# Patient Record
Sex: Male | Born: 1960 | Race: White | Hispanic: No | Marital: Married | Smoking: Never smoker
Health system: Southern US, Community
[De-identification: ages and names within clinical notes are randomized; demographics above are authoritative.]

## PROBLEM LIST (undated history)

## (undated) DIAGNOSIS — E119 Type 2 diabetes mellitus without complications: Secondary | ICD-10-CM

## (undated) DIAGNOSIS — I10 Essential (primary) hypertension: Secondary | ICD-10-CM

## (undated) HISTORY — PX: KNEE SURGERY: SHX244

## (undated) HISTORY — PX: TONSILLECTOMY: SUR1361

---

## 1986-04-18 HISTORY — PX: HERNIA REPAIR: SHX51

## 2003-02-03 ENCOUNTER — Encounter: Payer: Self-pay | Admitting: Specialist

## 2003-02-03 ENCOUNTER — Ambulatory Visit (HOSPITAL_COMMUNITY): Admission: RE | Admit: 2003-02-03 | Discharge: 2003-02-03 | Payer: Self-pay | Admitting: Specialist

## 2003-03-03 ENCOUNTER — Encounter: Admission: RE | Admit: 2003-03-03 | Discharge: 2003-06-01 | Payer: Self-pay | Admitting: Specialist

## 2008-09-16 ENCOUNTER — Encounter: Admission: RE | Admit: 2008-09-16 | Discharge: 2008-09-16 | Payer: Self-pay | Admitting: Emergency Medicine

## 2009-12-20 IMAGING — US US EXTREM LOW NON VASC*R*
1 series · 13 of 13 positions shown · non-contrast
Comparison: None

CLINICAL DATA: Palpable mass right upper thigh.

RIGHT LOWER EXTREMITY SOFT TISSUE ULTRASOUND
TECHNIQUE: Ultrasound examination of the soft tissues was
performed in the area of clinical concern.

[Series 1: us extrem low non vasc*right* · 0.06mm/px · 13 of 13 slices shown]
[im 1/13]
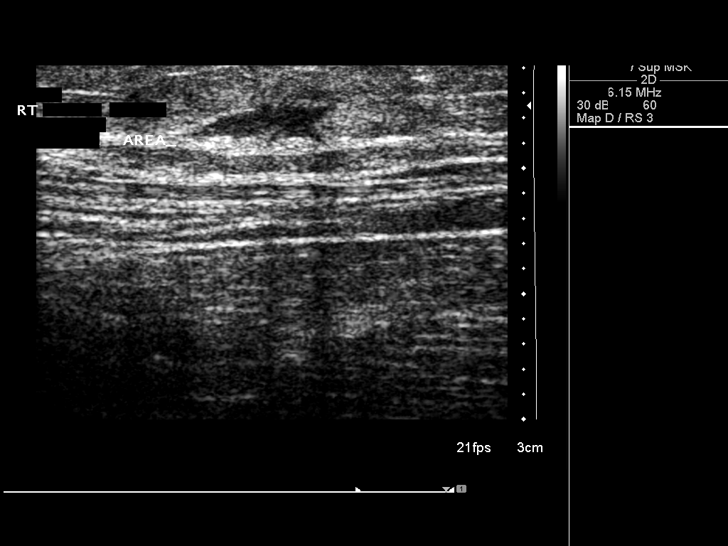
[im 2/13]
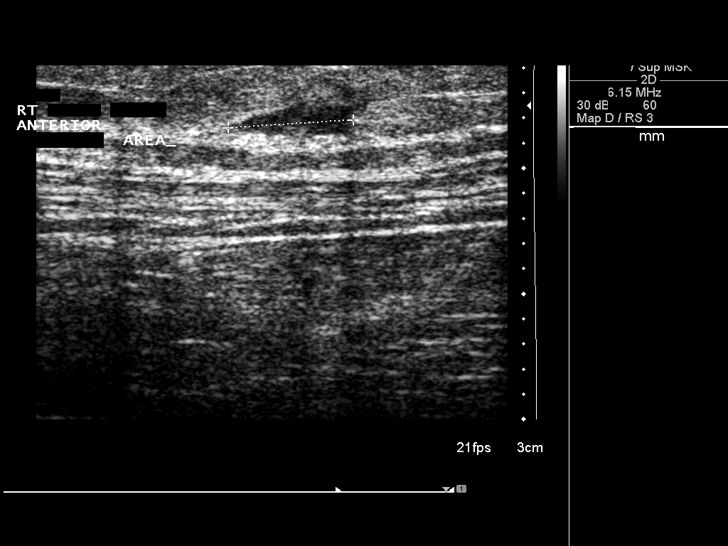
[im 3/13]
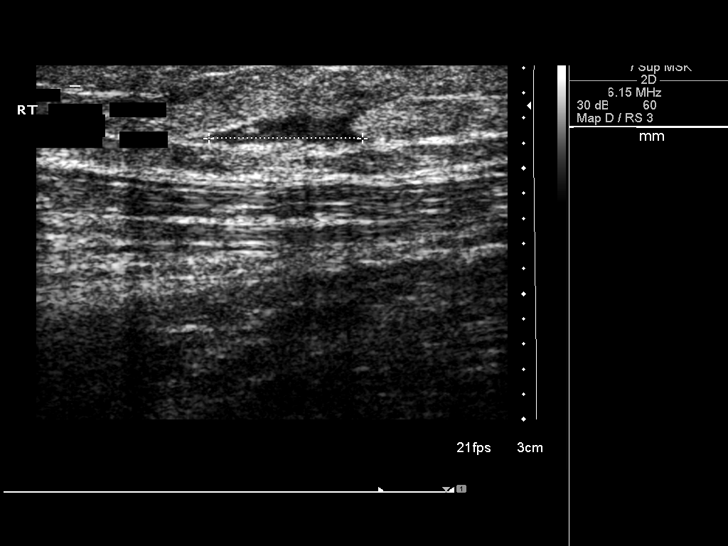
[im 4/13]
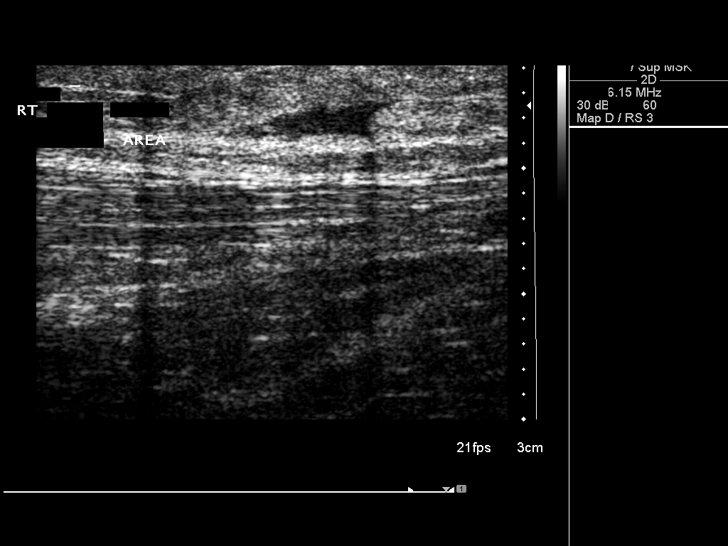
[im 5/13]
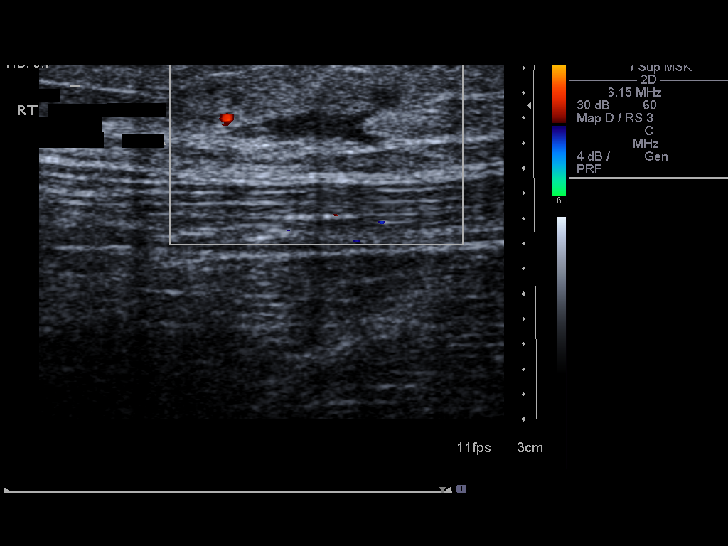
[im 6/13]
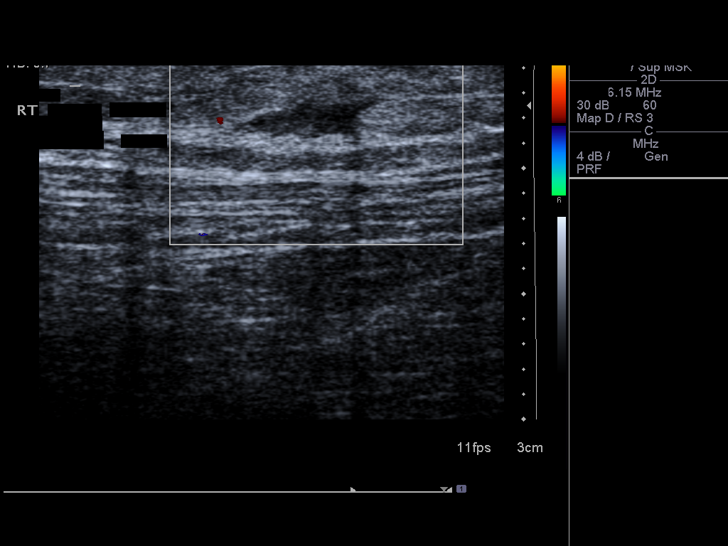
[im 7/13]
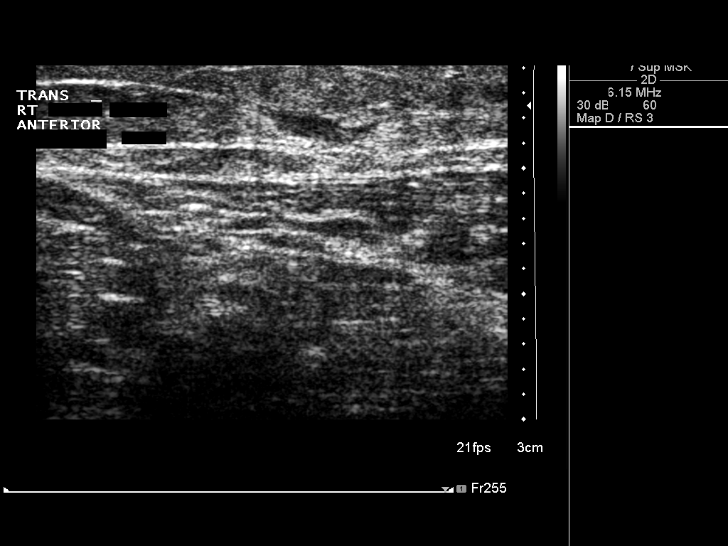
[im 8/13]
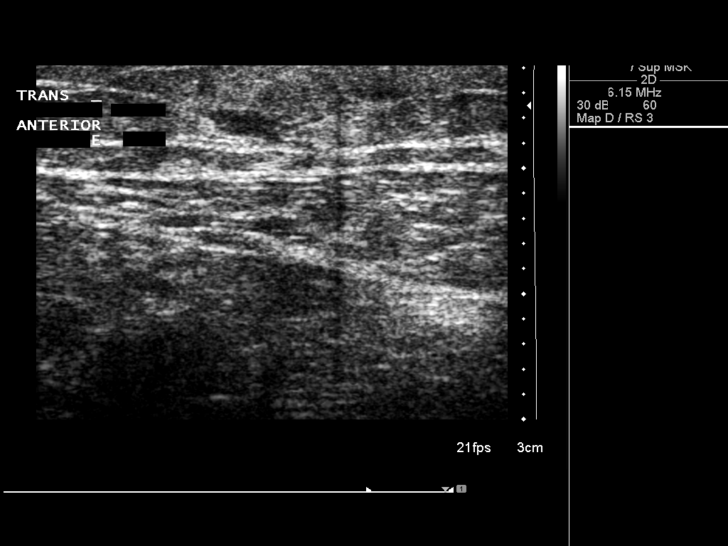
[im 9/13]
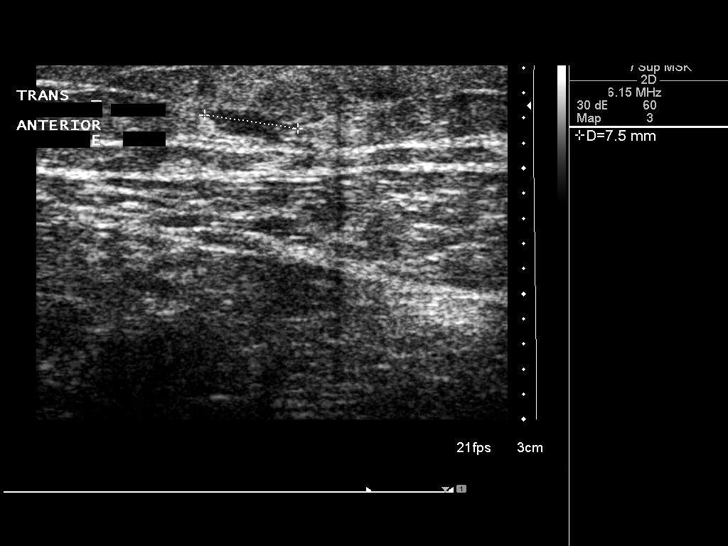
[im 10/13]
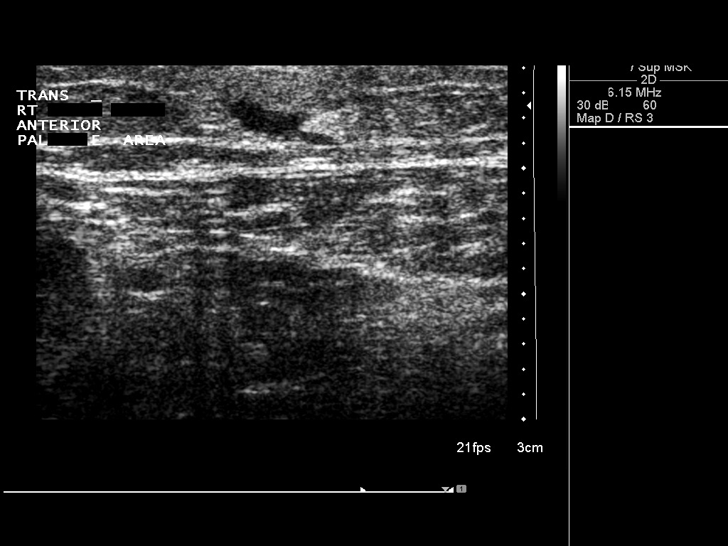
[im 11/13]
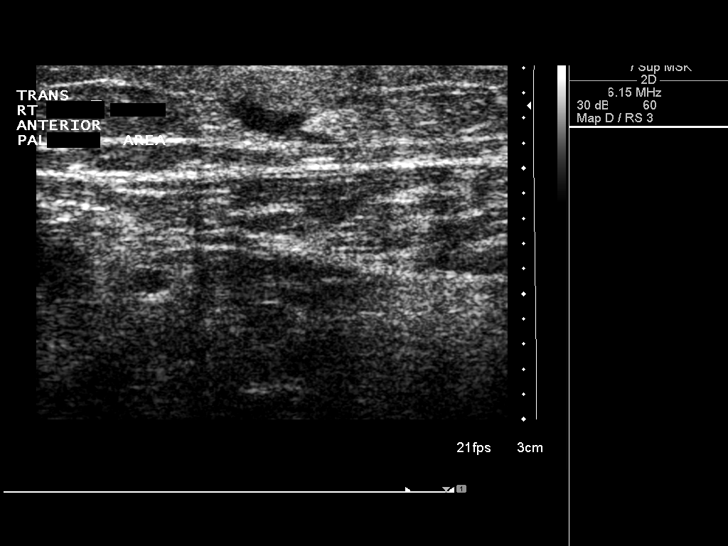
[im 12/13]
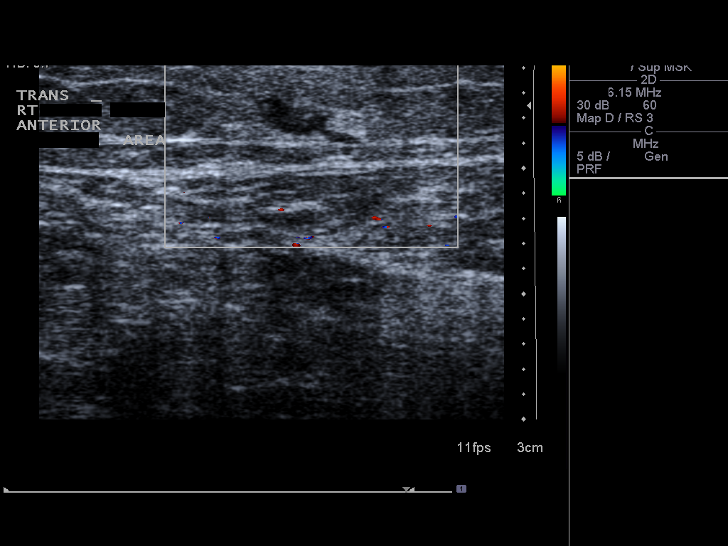
[im 13/13]
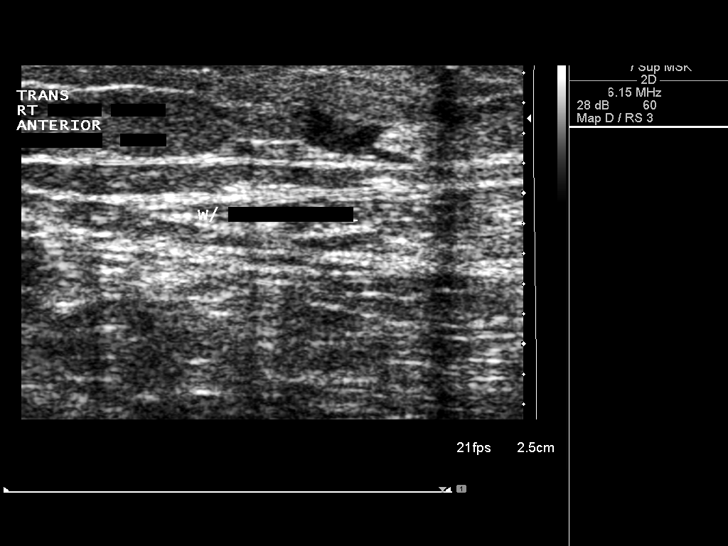

[13 of 13 positions shown; findings below may reference images not displayed]

FINDINGS: In region of palpable concern at the right upper thigh is
superficial nonspecific triangular shape hypoechoic focus measuring
1.2 cm long X 0.3 cm AP X 0.8 cm wide.  No internal or adjacent
blood flow or increased or decreased transmission of ultrasound
beam is seen related to the findings.  No evidence for focal lipoma
or solid lesion visualized.
IMPRESSION: Nonspecific triangular shaped hypoechoic nonvascular focus in
region of palpable concern (favoring slightly complex fluid
collection).  Recommend clinical follow-up.  If lesion persists or
increases, recommend right thigh MRI and/or ultrasound guided
needle aspiration for further specificity as clinically indicated.

## 2011-05-07 ENCOUNTER — Ambulatory Visit (INDEPENDENT_AMBULATORY_CARE_PROVIDER_SITE_OTHER): Payer: 59

## 2011-05-07 DIAGNOSIS — J168 Pneumonia due to other specified infectious organisms: Secondary | ICD-10-CM

## 2011-05-07 DIAGNOSIS — R05 Cough: Secondary | ICD-10-CM

## 2013-09-17 ENCOUNTER — Ambulatory Visit: Payer: 59 | Admitting: Podiatry

## 2014-01-17 ENCOUNTER — Ambulatory Visit (INDEPENDENT_AMBULATORY_CARE_PROVIDER_SITE_OTHER): Payer: 59 | Admitting: Podiatry

## 2014-01-17 ENCOUNTER — Encounter: Payer: Self-pay | Admitting: Podiatry

## 2014-01-17 VITALS — BP 136/76 | HR 84 | Ht 72.0 in | Wt 204.0 lb

## 2014-01-17 DIAGNOSIS — M2041 Other hammer toe(s) (acquired), right foot: Secondary | ICD-10-CM | POA: Insufficient documentation

## 2014-01-17 NOTE — Patient Instructions (Signed)
Seen for left foot lesion. Possible foreign body induced inclusion cyst.  Apply acid patches daily and return in one week for excision of the lesion.

## 2014-01-17 NOTE — Progress Notes (Signed)
Post op visit following tenotomy 3rd digit right. Stated that the toe felt better after 2 days. Suture removed. Compression bandage applied with home care instruction. Return as needed.

## 2014-01-27 ENCOUNTER — Ambulatory Visit: Payer: 59 | Admitting: Podiatry

## 2015-03-02 ENCOUNTER — Encounter: Payer: Self-pay | Admitting: Internal Medicine

## 2015-03-16 ENCOUNTER — Encounter: Payer: Self-pay | Admitting: Internal Medicine

## 2016-04-14 ENCOUNTER — Ambulatory Visit: Payer: Self-pay | Admitting: Podiatry

## 2016-05-10 ENCOUNTER — Ambulatory Visit (INDEPENDENT_AMBULATORY_CARE_PROVIDER_SITE_OTHER): Payer: Self-pay | Admitting: Podiatry

## 2016-05-10 ENCOUNTER — Encounter: Payer: Self-pay | Admitting: Podiatry

## 2016-05-10 DIAGNOSIS — L84 Corns and callosities: Secondary | ICD-10-CM

## 2016-05-10 NOTE — Patient Instructions (Addendum)
Seen for painful calluses. All lesions debrided. May benefit from using Lotrimin cream on cracked skin area. Return as needed.

## 2016-05-10 NOTE — Progress Notes (Signed)
SUBJECTIVE: 56 y.o. year old male presents complaining of painful calluses.  REVIEW OF SYSTEMS: Pertinent items noted in HPI and remainder of comprehensive ROS otherwise negative.  OBJECTIVE: DERMATOLOGIC EXAMINATION: Nails: Dystrophic nails x 10. Calluses: Painful callus under 5th MPJ L>R.  VASCULAR EXAMINATION OF LOWER LIMBS: Pedal pulses: All pedal pulses are palpable with normal pulsation.  No edema or erythema noted. Temperature gradient from tibial crest to dorsum of foot is within normal bilateral.  NEUROLOGIC EXAMINATION OF THE LOWER LIMBS: All epicritic and tactile sensations grossly intact.   MUSCULOSKELETAL EXAMINATION: No gross deformities.  ASSESSMENT: Painful callus under 5th MPJ with cracks under sulcus of 5th toe bilateral.  PLAN: All lesions debrided. May use Lotrimin cream for cracks under sulcus of 5th toe bilateral. Continue using Vitamin A oil for dry skin. Return as needed.

## 2016-07-30 ENCOUNTER — Emergency Department (HOSPITAL_BASED_OUTPATIENT_CLINIC_OR_DEPARTMENT_OTHER)
Admission: EM | Admit: 2016-07-30 | Discharge: 2016-07-30 | Disposition: A | Discharge status: Home / Self Care | Payer: 59 | Attending: Emergency Medicine | Admitting: Emergency Medicine

## 2016-07-30 ENCOUNTER — Emergency Department (HOSPITAL_BASED_OUTPATIENT_CLINIC_OR_DEPARTMENT_OTHER): Payer: 59

## 2016-07-30 ENCOUNTER — Encounter (HOSPITAL_BASED_OUTPATIENT_CLINIC_OR_DEPARTMENT_OTHER): Payer: Self-pay | Admitting: *Deleted

## 2016-07-30 DIAGNOSIS — Z794 Long term (current) use of insulin: Secondary | ICD-10-CM | POA: Insufficient documentation

## 2016-07-30 DIAGNOSIS — Y92410 Unspecified street and highway as the place of occurrence of the external cause: Secondary | ICD-10-CM | POA: Diagnosis not present

## 2016-07-30 DIAGNOSIS — M25512 Pain in left shoulder: Secondary | ICD-10-CM | POA: Insufficient documentation

## 2016-07-30 DIAGNOSIS — E119 Type 2 diabetes mellitus without complications: Secondary | ICD-10-CM | POA: Diagnosis not present

## 2016-07-30 DIAGNOSIS — Y9389 Activity, other specified: Secondary | ICD-10-CM | POA: Insufficient documentation

## 2016-07-30 DIAGNOSIS — Y999 Unspecified external cause status: Secondary | ICD-10-CM | POA: Diagnosis not present

## 2016-07-30 DIAGNOSIS — Z79899 Other long term (current) drug therapy: Secondary | ICD-10-CM | POA: Insufficient documentation

## 2016-07-30 DIAGNOSIS — I10 Essential (primary) hypertension: Secondary | ICD-10-CM | POA: Insufficient documentation

## 2016-07-30 DIAGNOSIS — Z7982 Long term (current) use of aspirin: Secondary | ICD-10-CM | POA: Diagnosis not present

## 2016-07-30 DIAGNOSIS — M542 Cervicalgia: Secondary | ICD-10-CM | POA: Insufficient documentation

## 2016-07-30 DIAGNOSIS — S199XXA Unspecified injury of neck, initial encounter: Secondary | ICD-10-CM | POA: Diagnosis present

## 2016-07-30 HISTORY — DX: Essential (primary) hypertension: I10

## 2016-07-30 HISTORY — DX: Type 2 diabetes mellitus without complications: E11.9

## 2016-07-30 MED ORDER — ACETAMINOPHEN 325 MG PO TABS: 650.0000 mg | ORAL_TABLET | Freq: Four times a day (QID) | ORAL | 0 refills | Status: AC | PRN

## 2016-07-30 MED ORDER — ACETAMINOPHEN 500 MG PO TABS
1000.0000 mg | ORAL_TABLET | Freq: Once | ORAL | Status: AC
  Administered 2016-07-30: 1000 mg via ORAL
  Filled 2016-07-30: qty 2

## 2016-07-30 MED ORDER — METHOCARBAMOL 500 MG PO TABS
1000.0000 mg | ORAL_TABLET | Freq: Once | ORAL | Status: AC
  Administered 2016-07-30: 1000 mg via ORAL
  Filled 2016-07-30: qty 2

## 2016-07-30 MED ORDER — DICLOFENAC SODIUM 3 % TD GEL: 1.0000 "application " | Freq: Two times a day (BID) | TRANSDERMAL | 0 refills | Status: DC

## 2016-07-30 MED ORDER — METHOCARBAMOL 1000 MG/10ML IJ SOLN
1000.0000 mg | Freq: Once | INTRAMUSCULAR | Status: DC
  Filled 2016-07-30: qty 10

## 2016-07-30 MED ORDER — METHOCARBAMOL 500 MG PO TABS: 500.0000 mg | ORAL_TABLET | Freq: Two times a day (BID) | ORAL | 0 refills | Status: DC | PRN

## 2016-07-30 NOTE — ED Notes (Signed)
ED Provider at bedside. 
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
## 2016-12-29 ENCOUNTER — Observation Stay (HOSPITAL_COMMUNITY)
Admission: EM | Admit: 2016-12-29 | Discharge: 2016-12-30 | DRG: 310 | Disposition: A | Discharge status: Home / Self Care | Payer: 59 | Attending: Cardiovascular Disease | Admitting: Cardiovascular Disease

## 2016-12-29 ENCOUNTER — Encounter (HOSPITAL_COMMUNITY): Payer: Self-pay | Admitting: *Deleted

## 2016-12-29 DIAGNOSIS — I4510 Unspecified right bundle-branch block: Secondary | ICD-10-CM | POA: Diagnosis present

## 2016-12-29 DIAGNOSIS — R0683 Snoring: Secondary | ICD-10-CM | POA: Diagnosis present

## 2016-12-29 DIAGNOSIS — E119 Type 2 diabetes mellitus without complications: Secondary | ICD-10-CM | POA: Diagnosis not present

## 2016-12-29 DIAGNOSIS — I440 Atrioventricular block, first degree: Secondary | ICD-10-CM | POA: Diagnosis present

## 2016-12-29 DIAGNOSIS — I4892 Unspecified atrial flutter: Secondary | ICD-10-CM | POA: Diagnosis present

## 2016-12-29 DIAGNOSIS — I483 Typical atrial flutter: Principal | ICD-10-CM

## 2016-12-29 DIAGNOSIS — Z8249 Family history of ischemic heart disease and other diseases of the circulatory system: Secondary | ICD-10-CM

## 2016-12-29 DIAGNOSIS — I10 Essential (primary) hypertension: Secondary | ICD-10-CM | POA: Diagnosis not present

## 2016-12-29 DIAGNOSIS — Z7982 Long term (current) use of aspirin: Secondary | ICD-10-CM

## 2016-12-29 DIAGNOSIS — E785 Hyperlipidemia, unspecified: Secondary | ICD-10-CM | POA: Diagnosis not present

## 2016-12-29 LAB — I-STAT CHEM 8, ED
BUN: 19 mg/dL (ref 6–20)
CALCIUM ION: 1.17 mmol/L (ref 1.15–1.40)
CREATININE: 0.9 mg/dL (ref 0.61–1.24)
Chloride: 101 mmol/L (ref 101–111)
GLUCOSE: 143 mg/dL — AB (ref 65–99)
HCT: 49 % (ref 39.0–52.0)
Hemoglobin: 16.7 g/dL (ref 13.0–17.0)
Potassium: 4.6 mmol/L (ref 3.5–5.1)
Sodium: 139 mmol/L (ref 135–145)
TCO2: 28 mmol/L (ref 22–32)

## 2016-12-29 LAB — BASIC METABOLIC PANEL
ANION GAP: 9 (ref 5–15)
BUN: 15 mg/dL (ref 6–20)
CO2: 26 mmol/L (ref 22–32)
Calcium: 9.7 mg/dL (ref 8.9–10.3)
Chloride: 102 mmol/L (ref 101–111)
Creatinine, Ser: 1.01 mg/dL (ref 0.61–1.24)
GFR calc Af Amer: >60 mL/min (ref >60)
GFR calc non Af Amer: >60 mL/min (ref >60)
Glucose, Bld: 154 mg/dL — ABNORMAL HIGH (ref 65–99)
POTASSIUM: 4.7 mmol/L (ref 3.5–5.1)
Sodium: 137 mmol/L (ref 135–145)

## 2016-12-29 LAB — CBC
HEMATOCRIT: 47.7 % (ref 39.0–52.0)
HEMOGLOBIN: 16.4 g/dL (ref 13.0–17.0)
MCH: 32.1 pg (ref 26.0–34.0)
MCHC: 34.4 g/dL (ref 30.0–36.0)
MCV: 93.3 fL (ref 78.0–100.0)
Platelets: 245 10*3/uL (ref 150–400)
RBC: 5.11 MIL/uL (ref 4.22–5.81)
RDW: 13.8 % (ref 11.5–15.5)
WBC: 6.4 10*3/uL (ref 4.0–10.5)

## 2016-12-29 LAB — MAGNESIUM: Magnesium: 1.9 mg/dL (ref 1.7–2.4)

## 2016-12-29 LAB — TSH: TSH: 1.909 u[IU]/mL (ref 0.350–4.500)

## 2016-12-29 MED ORDER — ATORVASTATIN CALCIUM 40 MG PO TABS
40.0000 mg | ORAL_TABLET | Freq: Every day | ORAL | Status: DC
  Administered 2016-12-30: 40 mg via ORAL
  Filled 2016-12-29: qty 1

## 2016-12-29 MED ORDER — METOPROLOL TARTRATE 25 MG PO TABS
25.0000 mg | ORAL_TABLET | Freq: Two times a day (BID) | ORAL | Status: DC
  Administered 2016-12-30: 25 mg via ORAL
  Filled 2016-12-29: qty 1

## 2016-12-29 MED ORDER — SODIUM CHLORIDE 0.9% FLUSH: 3.0000 mL | INTRAVENOUS | Status: DC | PRN

## 2016-12-29 MED ORDER — DILTIAZEM HCL 100 MG IV SOLR
5.0000 mg/h | INTRAVENOUS | Status: DC
  Filled 2016-12-29: qty 100

## 2016-12-29 MED ORDER — DILTIAZEM HCL 100 MG IV SOLR
5.0000 mg/h | Freq: Once | INTRAVENOUS | Status: AC
  Administered 2016-12-29: 5 mg/h via INTRAVENOUS

## 2016-12-29 MED ORDER — DILTIAZEM HCL 25 MG/5ML IV SOLN
20.0000 mg | Freq: Once | INTRAVENOUS | Status: AC
  Administered 2016-12-29: 20 mg via INTRAVENOUS
  Filled 2016-12-29: qty 5

## 2016-12-29 MED ORDER — ZOLPIDEM TARTRATE 5 MG PO TABS
5.0000 mg | ORAL_TABLET | Freq: Every evening | ORAL | Status: DC | PRN
  Administered 2016-12-29: 5 mg via ORAL
  Filled 2016-12-29: qty 1

## 2016-12-29 MED ORDER — ACETAMINOPHEN 325 MG PO TABS: 650.0000 mg | ORAL_TABLET | ORAL | Status: DC | PRN

## 2016-12-29 MED ORDER — ASPIRIN 81 MG PO CHEW
81.0000 mg | CHEWABLE_TABLET | Freq: Every day | ORAL | Status: DC
  Administered 2016-12-30: 81 mg via ORAL
  Filled 2016-12-29: qty 1

## 2016-12-29 MED ORDER — ONDANSETRON HCL 4 MG/2ML IJ SOLN: 4.0000 mg | Freq: Four times a day (QID) | INTRAMUSCULAR | Status: DC | PRN

## 2016-12-29 MED ORDER — DILTIAZEM HCL 100 MG IV SOLR
5.0000 mg/h | Freq: Once | INTRAVENOUS | Status: AC
  Administered 2016-12-29: 5 mg/h via INTRAVENOUS
  Filled 2016-12-29: qty 100

## 2016-12-29 MED ORDER — APIXABAN 5 MG PO TABS
5.0000 mg | ORAL_TABLET | Freq: Two times a day (BID) | ORAL | Status: DC
  Filled 2016-12-29: qty 1

## 2016-12-29 MED ORDER — SODIUM CHLORIDE 0.9% FLUSH
3.0000 mL | Freq: Two times a day (BID) | INTRAVENOUS | Status: DC
  Administered 2016-12-30: 3 mL via INTRAVENOUS

## 2016-12-29 MED ORDER — SODIUM CHLORIDE 0.9 % IV BOLUS (SEPSIS)
1000.0000 mL | Freq: Once | INTRAVENOUS | Status: AC
  Administered 2016-12-29: 1000 mL via INTRAVENOUS

## 2016-12-29 MED ORDER — SODIUM CHLORIDE 0.9 % IV SOLN: 250.0000 mL | INTRAVENOUS | Status: DC | PRN

## 2016-12-29 MED ORDER — APIXABAN 5 MG PO TABS
5.0000 mg | ORAL_TABLET | Freq: Two times a day (BID) | ORAL | Status: DC
  Administered 2016-12-29 – 2016-12-30 (×2): 5 mg via ORAL
  Filled 2016-12-29 (×2): qty 1

## 2016-12-29 NOTE — ED Notes (Signed)
Tiffany Reece AgarG. Cardiology PA, returned page, advised this RN to maintain cardizem at 5 unless SBP <90. If SBP <90 turn off cardizem and page cardiology.

## 2016-12-29 NOTE — Progress Notes (Signed)
    [facility] has been requested to perform a transesophageal echocardiogram on Thomas Juarez for atrial flutter.  After careful review of history and examination, the risks and benefits of transesophageal echocardiogram have been explained including risks of esophageal damage, perforation (1:10,000 risk), bleeding, pharyngeal hematoma as well as other potential complications associated with conscious sedation including aspiration, arrhythmia, respiratory failure and death. Alternatives to treatment were discussed, questions were answered. Patient is willing to proceed.   Patient is scheduled for TEE/DCCV with Dr. Delton SeeNelson on 01/02/17 at 0800. NPO Sunday night MN.  Roe Rutherfordngela Nicole Niyati Heinke, [place]  12/29/2016 4:51 PM

## 2016-12-29 NOTE — ED Provider Notes (Signed)
Emergency Department Provider Note   I have reviewed the triage vital signs and the nursing notes.   HISTORY  Chief Complaint Tachycardia   HPI Thomas Juarez is a 56 y.o. male With a past medical history of hypertension diabetes presents the emergency department today secondary to asymptomatic tachycardia. His daughter is doing a CNA course and she was checking his vital signs and found that he had nonpalpable. His wife checked it later with a stethoscope and found a heart rate around 145-150 he would see his doctor today who confirmed and sent here for further evaluation. He has no syncope, chest pain, source of breath, lower Extremity swelling, abdominal pain or other Evidence ofheart failure or end organ damage.    Past Medical History:  Diagnosis Date  . Diabetes mellitus without complication (HCC)   . Hypertension     Patient Active Problem List   Diagnosis Date Noted  . Hammer toe of right foot 01/17/2014    Past Surgical History:  Procedure Laterality Date  . HERNIA REPAIR  1988  . KNEE SURGERY Bilateral 2005, 2007  . TONSILLECTOMY      Current Outpatient Rx  . Order #: [id number] Class: Historical Med  . Order #: [id number] Class: Historical Med  . Order #: [id number] Class: Historical Med  . Order #: [id number] Class: Historical Med  . Order #: [id number] Class: Historical Med  . Order #: [id number] Class: Historical Med  . Order #: [id number] Class: Historical Med  . Order #: [id number] Class: Historical Med  . Order #: [id number] Class: Historical Med  . Order #: [id number] Class: Print  . Order #: [id number] Class: Print  . Order #: [id number] Class: Print    Allergies Metformin  Family History  Problem Relation Age of Onset  . Hypertension Father     Social History Social History  Substance Use Topics  . Smoking status: Never Smoker  . Smokeless tobacco: Never Used  . Alcohol use Yes     Comment: daily    Review of Systems  All other systems negative except as  documented in the HPI. All pertinent positives and negatives as reviewed in the HPI. ____________________________________________   PHYSICAL EXAM:  VITAL SIGNS: ED Triage Vitals  Enc Vitals Group     BP 12/29/16 1325 (!) 119/94     Pulse Rate 12/29/16 1317 (!) 154     Resp 12/29/16 1317 16     Temp 12/29/16 1317 97.8 F (36.6 C)     Temp Source 12/29/16 1317 Oral     SpO2 12/29/16 1317 100 %     Weight --      Height --      Head Circumference --      Peak Flow --      Pain Score --      Pain Loc --      Pain Edu? --      Excl. in GC? --     Constitutional: Alert and oriented. Well appearing and in no acute distress. Eyes: Conjunctivae are normal. PERRL. EOMI. Head: Atraumatic. Nose: No congestion/rhinnorhea. Mouth/Throat: Mucous membranes are moist.  Oropharynx non-erythematous. Neck: No stridor.  No meningeal signs.   Cardiovascular: tachycardic rate, regular rhythm. Good peripheral circulation. Grossly normal heart sounds.   Respiratory: Normal respiratory effort.  No retractions. Lungs CTAB. Gastrointestinal: Soft and nontender. No distention.  Musculoskeletal: No lower extremity tenderness nor edema. No gross deformities of extremities. Neurologic:  Normal speech and language. No gross focal neurologic deficits are appreciated.  Skin:  Skin is warm, dry  and intact. No rash noted.   ____________________________________________   LABS (all labs ordered are listed, but only abnormal results are displayed)  Labs Reviewed  BASIC METABOLIC PANEL - Abnormal; Notable for the following:       Result Value   Glucose, Bld 154 (*)    All other components within normal limits  I-STAT CHEM 8, ED - Abnormal; Notable for the following:    Glucose, Bld 143 (*)    All other components within normal limits  CBC  TSH  MAGNESIUM   ____________________________________________  EKG   EKG Interpretation  Date/Time:  Thursday December 29 2016 14:00:59 EDT Ventricular  Rate:  91 PR Interval:  128 QRS Duration: 162 QT Interval:  352 QTC Calculation: 429 R Axis:   117 Text Interpretation:  Atrial flutter Ventricular premature complex RBBB and LPFB slower rate than previously, but still atrial flutter Confirmed by Marily Memos [phone]) on 12/29/2016 3:23:20 PM       ____________________________________________  RADIOLOGY  No results found.  ____________________________________________   PROCEDURES  Procedure(s) performed:   Procedures  CRITICAL CARE Performed by: Marily Memos Total critical care time: 35 minutes Critical care time was exclusive of separately billable procedures and treating other patients. Critical care was necessary to treat or prevent imminent or life-threatening deterioration. Critical care was time spent personally by me on the following activities: development of treatment plan with patient and/or surrogate as well as nursing, discussions with consultants, evaluation of patient's response to treatment, examination of patient, obtaining history from patient or surrogate, ordering and performing treatments and interventions, ordering and review of laboratory studies, ordering and review of radiographic studies, pulse oximetry and re-evaluation of patient's condition.  ____________________________________________   INITIAL IMPRESSION / ASSESSMENT AND PLAN / ED COURSE  Pertinent labs & imaging results that were available during my care of the patient were reviewed by me and considered in my medical decision making (see chart for details). Asymptomatic atrial flutter. Improved with diltiazem bolus. eliquis ordered [facility] cardiology who will see for recommendations.  Reevaluation and HR back up near 150. Will start infusion.   Care transferred pending cardiology evaluation and recommendations.  ____________________________________________  FINAL CLINICAL IMPRESSION(S) / ED DIAGNOSES  Final diagnoses:  None      MEDICATIONS GIVEN DURING THIS VISIT:  Medications  apixaban (ELIQUIS) tablet 5 mg (not administered)  diltiazem (CARDIZEM) injection 20 mg (20 mg Intravenous Given 12/29/16 1356)  sodium chloride 0.9 % bolus 1,000 mL (1,000 mLs Intravenous New Bag/Given 12/29/16 1356)     NEW OUTPATIENT MEDICATIONS STARTED DURING THIS VISIT:  New Prescriptions   No medications on file    Note:  This document was prepared using Dragon voice recognition software and may include unintentional dictation errors.   Marily Memos, MD 12/29/16 [phone]

## 2016-12-29 NOTE — H&P (Addendum)
Cardiology H and P:   Patient ID: Thomas Juarez; [id number]; 1960/11/24   Admit date: 12/29/2016 Date of Consult: 12/29/2016  Primary Care Provider: Iva BoopVia, Kevin, MD Primary Cardiologist: New Primary Electrophysiologist:  None   Patient Profile:   Thomas Juarez is a 56 y.o. male with a hx of Diabetes and hypertension, but no coronary issues, who is being seen today for the evaluation of rapid atrial fibrillation at the request of Dr. Clayborne DanaMesner.  History of Present Illness:   Thomas Juarez's Daughter was practicing for CNA test. She noted that his heart rate was very high. He went to his PCP this morning and was found to be in rapid atrial fibrillation. He was sent to the emergency room.  In the emergency room, he was not having chest pain or shortness of breath. His heart rate was initially 154. He was given IV Cardizem x 1, which slowed his heart rate to 75. Dr. Erin HearingMessner requested that cardiology evaluate him, determine if admission is needed and if not what his discharge medications should be. TSH is ordered but not completed.  On my interview, he states that he has never felt palpitations, denies dizziness, lightheadedness, and feelings of near-syncope. Telemetry reveals atrial flutter. He received one dose of IV cardizem 20 mg with some rate control. Now, ventricular rate is in the 140s.   Started eliquis and will order PO cardizem.  He snores, but never diagnosed with OSA. Consider sleep study in the future. He is a never smoker. He denies pulmonary disease.   Past Medical History:  Diagnosis Date  . Diabetes mellitus without complication (HCC)   . Hypertension     Past Surgical History:  Procedure Laterality Date  . HERNIA REPAIR  1988  . KNEE SURGERY Bilateral 2005, 2007  . TONSILLECTOMY       Home Medications:  Prior to Admission medications   Medication Sig Start Date End Date Taking? Authorizing Provider  acetaminophen (TYLENOL) 325 MG tablet Take 2 tablets (650  mg total) by mouth every 6 (six) hours as needed for mild pain or moderate pain. 07/30/16   Everlene Farrieransie, William, PA-C  aspirin 81 MG tablet Take 81 mg by mouth daily.    [provider]  atorvastatin (LIPITOR) 40 MG tablet  12/18/13   [provider]  Diclofenac Sodium 3 % GEL Place 1 application onto the skin 2 (two) times daily. To affected area. 07/30/16   Everlene Farrieransie, William, PA-C  INVOKANA 300 MG TABS  12/19/13   [provider]  methocarbamol (ROBAXIN) 500 MG tablet Take 1 tablet (500 mg total) by mouth 2 (two) times daily as needed for muscle spasms. 07/30/16   Everlene Farrieransie, William, PA-C  pioglitazone (ACTOS) 45 MG tablet  12/19/13   [provider]  ramipril (ALTACE) 5 MG capsule  12/19/13   [provider]  TANZEUM 50 MG PEN  01/07/14   [provider]  Vitamin D, Ergocalciferol, (DRISDOL) 50000 UNITS CAPS capsule  11/21/13   [provider]    Inpatient Medications: Scheduled Meds:  Continuous Infusions: Sodium chloride thousand milliliters  PRN Meds: Cardizem 20 mg IV 1  Allergies:   No Known Allergies  Social History:   Social History   Social History  . Marital status: Married    Spouse name: N/A  . Number of children: N/A  . Years of education: N/A   Occupational History  . Not on file.   Social History Main Topics  . Smoking status: Never  Smoker  . Smokeless tobacco: Never Used  . Alcohol use Yes     Comment: daily  . Drug use: No  . Sexual activity: No   Other Topics Concern  . Not on file   Social History Narrative  . No narrative on file    Family History:    Family History  Problem Relation Age of Onset  . Hypertension Father      ROS:  Please see the history of present illness.  ROS  All other ROS reviewed and negative.     Physical Exam/Data:   Vitals:   12/29/16 1332 12/29/16 1400 12/29/16 1415 12/29/16 1430  BP: (!) 115/94 91/67 107/77 104/70  Pulse: 73 (!) 49 63 75  Resp: Temp:      TempSrc:      SpO2: 99% 98% 100% 100%   No intake or output data in the 24 hours ending 12/29/16 1454 There were no vitals filed for this visit. There is no height or weight on file to calculate BMI.  General:  Well nourished, well developed, in no acute distress HEENT: normal Neck: no JVD Vascular: No carotid bruits; FA pulses 2+ bilaterally without bruits  Cardiac:  Irregular rhythm, irregular rate, no murmur Lungs:  clear to auscultation bilaterally, no wheezing, rhonchi or rales  Abd: soft, nontender, no hepatomegaly  Ext: no edema Musculoskeletal:  No deformities, BUE and BLE strength normal and equal Skin: warm and dry  Neuro:  CNs 2-12 intact, no focal abnormalities noted Psych:  Normal affect   EKG:  The EKG was personally reviewed and demonstrates:  Atrial flutter, ventricular rate 91 Telemetry:  Telemetry was personally reviewed and demonstrates:  Atrial tachycardia vs 2:1 flutter, previously rate controlled, now in the 140-150s   Relevant CV Studies:  Echocardiogram: pending  Laboratory Data:  Chemistry  Recent Labs Lab 12/29/16 1327 12/29/16 1449  NA 137 139  K 4.7 4.6  CL 102 101  CO2 26  --   GLUCOSE 154* 143*  BUN 15 19  CREATININE 1.01 0.90  CALCIUM 9.7  --   GFRNONAA >60  --   GFRAA >60  --   ANIONGAP 9  --     No results for input(s): PROT, ALBUMIN, AST, ALT, ALKPHOS, BILITOT in the last 168 hours. Hematology  Recent Labs Lab 12/29/16 1327 12/29/16 1449  WBC 6.4  --   RBC 5.11  --   HGB 16.4 16.7  HCT 47.7 49.0  MCV 93.3  --   MCH 32.1  --   MCHC 34.4  --   RDW 13.8  --   PLT 245  --    Cardiac EnzymesNo results for input(s): TROPONINI in the last 168 hours. No results for input(s): TROPIPOC in the last 168 hours.  BNPNo results for input(s): BNP, PROBNP in the last 168 hours.  DDimer No results for input(s): DDIMER in the last 168 hours.  Radiology/Studies:  No results found.  Assessment and Plan:   1. Atrial  fibrillation, rapid ventricular response: - Anticoagulation: This patients CHA2DS2-VASc Score and unadjusted Ischemic Stroke Rate (% per year) is equal to 2.2 % stroke rate/year from a score of 2  (HTN, DM) - will start eliquis for anticoagulation as no procedures are planned at this time - it is unknown how long he has been in an atrial arrhythmia - he denies palpitations, lightheadedness, dizziness - he was rate controlled after one dose of 20 mg IV diltiazem,  but has now increased to the 130-150s - started diltiazem drip and lopressor 25 mg BID - echo pending - discussed possible TEE/DCCV tomorrow  Plan to admit to cardiology for rate control. Plan for TEE/DCCV Monday 01/02/17 at 0800.    2. HTN - home meds: ramipril on hold - BP 90-110s - will hold ramipril while titrating cardizem for rate control   3. DM - home invokana and actos   4. HLD - on lipitor, no lipid panel in EPIC    For questions or updates, please contact [facility] Please consult [email or web address] for contact info under Cardiology/STEMI.   Signed, Marcelino Duster, PA  12/29/2016 2:54 PM   I have seen and examined the patient along with Marcelino Duster, PA .  I have reviewed the chart, notes and new data.  I agree with PA/NP's note.  Key new complaints: He is completely unaware of the arrhythmia, even though heart rate is 150 bpm at rest. Duration of arrhythmia is uncertain, but it's been definitely at least 24 hours. Does not have symptoms suggestive of obstructive sleep apnea Key examination changes: No overt signs of congestive heart failure. Cardiovascular exam is difficult because of tachycardia, but I do not hear any murmurs or gallops. Key new findings / data: Heart size was not enlarged on chest x-ray performed in April. ECG is normal except for the presence of atrial flutter with 2:1 AV block. He has typical counterclockwise right atrial flutter.  PLAN: Start oral anticoagulants. Risks and  benefits of anticoagulation discussed in detail. Schedule for TEE guided cardioversion. Since he needs to receive 3 doses of anticoagulant first, at this point we are unable to schedule the procedure until Monday morning at 8 AM. Schedule is all booked for Friday anyway. Meanwhile work on achieving rate controlled, but I am kept a cold that will be very successful with this very organized arrhythmia in this relatively young man. It'll be very hard in particular to control his exercise heart rate. Long-term would be well served by referral for EP evaluation and cavotricuspid isthmus ablation. If successful, this would obviate the need for long-term anticoagulation. However, first need to workup for any evidence of structural heart disease and assess his risk for recurrent arrhythmia in the form of atrial fibrillation, which would reduce the benefit of RF ablation for flutter. Schedule for echo, consider outpatient treadmill stress test after the arrhythmia is treated.  Thurmon Fair, MD, [facility] [phone] 12/29/2016, 4:55 PM

## 2016-12-29 NOTE — ED Notes (Signed)
Dr. Mesner at bedside   

## 2016-12-29 NOTE — Progress Notes (Signed)
    Paged by Nena AlexanderN Lenze in the ED. Patient has had 20 mg IV bolus of Cardizem and is now on a 5 mg IV Cardizem drip. RN called me concerned that heart rate staying in the 150s and BP dropping to 92 systolic.  I spoke with Dr. Royann Shiversroitoru, he recommended not increasing any of the rate controlling medications and to give the medication time to work as the patient is asymptomatic and his heart rate returns to normal with sleep.   Marlon Peliffany Tjay Velazquez, PA-C 12/29/2016 5:40 pm

## 2016-12-29 NOTE — Progress Notes (Signed)
ANTICOAGULATION CONSULT NOTE - Initial Consult  Pharmacy Consult for eliquis Indication: atrial fibrillation  Allergies  Allergen Reactions  . Metformin Other (See Comments)    Vital Signs: Temp: 97.8 F (36.6 C) (09/13 1317) Temp Source: Oral (09/13 1317) BP: 104/70 (09/13 1430) Pulse Rate: 75 (09/13 1430)  Labs:  Recent Labs  12/29/16 1327 12/29/16 1449  HGB 16.4 16.7  HCT 47.7 49.0  PLT 245  --   CREATININE 1.01 0.90    CrCl cannot be calculated (Unknown ideal weight.).   Medical History: Past Medical History:  Diagnosis Date  . Diabetes mellitus without complication (HCC)   . Hypertension     Assessment: Philis Kendallommy Darren Raynor is a 56 y.o. male with new atrial fibrillation. No anticoagulation documented PTA. CBC WNL,   Goal of Therapy:  Monitor platelets by anticoagulation protocol: Yes   Plan:  Eliquis 5mg  BID Monitor renal function and for s/sx of bleeding  Toniann Failony L Zadiel Leyh 12/29/2016,4:33 PM

## 2016-12-29 NOTE — ED Triage Notes (Addendum)
To ED via POV, from PMD's office, for eval of high heart rate. 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 She told him it was so fast she couldn't count it. Went to PMD this AM and told to come to the ED for further eval. Pt denies pain, sob, or other discomfort. No nausea or vomiting. Pt states he drinks 'alot' of caffeine each day... Starts he starts his day with a 30 oz coffee, followed by unsweet tea and diet pepsi all day. Pt states he does not feel his heart racing. Took  ASA this am

## 2016-12-29 NOTE — ED Notes (Signed)
Patient transported to X-ray 

## 2016-12-30 ENCOUNTER — Inpatient Hospital Stay (HOSPITAL_COMMUNITY): Payer: 59

## 2016-12-30 DIAGNOSIS — E119 Type 2 diabetes mellitus without complications: Secondary | ICD-10-CM

## 2016-12-30 DIAGNOSIS — I4892 Unspecified atrial flutter: Secondary | ICD-10-CM | POA: Diagnosis not present

## 2016-12-30 DIAGNOSIS — I483 Typical atrial flutter: Secondary | ICD-10-CM | POA: Diagnosis not present

## 2016-12-30 DIAGNOSIS — Z8249 Family history of ischemic heart disease and other diseases of the circulatory system: Secondary | ICD-10-CM | POA: Diagnosis not present

## 2016-12-30 DIAGNOSIS — R0683 Snoring: Secondary | ICD-10-CM | POA: Diagnosis not present

## 2016-12-30 DIAGNOSIS — I10 Essential (primary) hypertension: Secondary | ICD-10-CM | POA: Diagnosis not present

## 2016-12-30 LAB — ECHOCARDIOGRAM COMPLETE
AVLVOTPG: 3 mmHg
E decel time: 156 msec
E/e' ratio: 9
FS: 29 % (ref 28–44)
IV/PV OW: 0.84
LA diam end sys: 39 mm
LA vol: 62.7 mL
LADIAMINDEX: 1.85 cm/m2
LASIZE: 39 mm
LAVOLA4C: 53.9 mL
LAVOLIN: 29.7 mL/m2
LV E/e' medial: 9
LV PW d: 8.72 mm — AB (ref 0.6–1.1)
LV TDI E'LATERAL: 15
LV TDI E'MEDIAL: 13.1
LV e' LATERAL: 15 cm/s
LVEEAVG: 9
LVOT VTI: 20.2 cm
LVOT area: 3.46 cm2
LVOT diameter: 21 mm
LVOT peak vel: 91.7 cm/s
LVOTSV: 70 mL
Lateral S' vel: 14.5 cm/s
MV Dec: 156
MV Peak grad: 7 mmHg
MVPKAVEL: 76.4 m/s
MVPKEVEL: 135 m/s
TAPSE: 25.3 mm

## 2016-12-30 LAB — CBG MONITORING, ED
Glucose-Capillary: 129 mg/dL — ABNORMAL HIGH (ref 65–99)
Glucose-Capillary: 178 mg/dL — ABNORMAL HIGH (ref 65–99)

## 2016-12-30 LAB — HIV ANTIBODY (ROUTINE TESTING W REFLEX): HIV SCREEN 4TH GENERATION: NONREACTIVE

## 2016-12-30 LAB — GLUCOSE, CAPILLARY: Glucose-Capillary: 183 mg/dL — ABNORMAL HIGH (ref 65–99)

## 2016-12-30 MED ORDER — INSULIN ASPART 100 UNIT/ML ~~LOC~~ SOLN
0.0000 [IU] | Freq: Three times a day (TID) | SUBCUTANEOUS | Status: DC
  Administered 2016-12-30 (×2): 3 [IU] via SUBCUTANEOUS
  Filled 2016-12-30: qty 1

## 2016-12-30 MED ORDER — APIXABAN 5 MG PO TABS: 5.0000 mg | ORAL_TABLET | Freq: Two times a day (BID) | ORAL | 11 refills | Status: DC

## 2016-12-30 MED ORDER — METOPROLOL TARTRATE 25 MG PO TABS: 25.0000 mg | ORAL_TABLET | Freq: Two times a day (BID) | ORAL | 11 refills | Status: DC

## 2016-12-30 MED ORDER — SODIUM CHLORIDE 0.9 % IV BOLUS (SEPSIS)
200.0000 mL | Freq: Once | INTRAVENOUS | Status: AC
  Administered 2016-12-30: 200 mL via INTRAVENOUS

## 2016-12-30 NOTE — ED Notes (Signed)
Pt given breakfast tray

## 2016-12-30 NOTE — Discharge Summary (Signed)
Discharge Summary    Patient ID: Thomas Juarez,  MRN: [id number], DOB/AGE: [birth year] 56 y.o.  Admit date: 12/29/2016 Discharge date: 12/30/2016   Primary Care Provider: Iva Boop Primary Cardiologist: new - Dr. Royann Shivers  Discharge Diagnoses    Principal Problem:   Atrial flutter [facility]) Active Problems:   Hypertension   Diabetes mellitus without complication (HCC)   Allergies Allergies  Allergen Reactions  . Metformin Other (See Comments)     History of Present Illness     Thomas Juarez is a 56 y.o. male with a hx of Diabetes and hypertension, but no coronary issues, who was seen in the [facility] for atrial flutter on 12/29/16.  Thomas Juarez Daughter was practicing for CNA test. She noted that his heart rate was very high. He went to his PCP this morning and was found to be in rapid atrial fibrillation. He was sent to the emergency room.  In the emergency room, he was not having chest pain or shortness of breath. His heart rate was initially 154. He was given IV Cardizem x 1, which slowed his heart rate to 75. Dr. Erin Hearing requested that cardiology evaluate him, determine if admission is needed and if not what his discharge medications should be. TSH is ordered but not completed.  On my interview, he states that he has never felt palpitations, denies dizziness, lightheadedness, and feelings of near-syncope. Telemetry reveals atrial flutter. He received one dose of IV cardizem 20 mg with some rate control. Now, ventricular rate is in the 140s.   Started eliquis and will ordered cardizem drip.  He snores, but never diagnosed with OSA. Consider sleep study in the future. He is a never smoker. He denies pulmonary disease.  Hospital Course     Consultants: None  Thomas Juarez was admitted to cardiology, started eliquis, lopressor, and a cardizem drip. Cardizem drip ran at 5 mg/hr. Metoprolol 25 mg BID. He became hypotensive overnight and cardizem drip was D/C'ed. He  continued on lopressor. He converted to NSR at 0600 today 12/30/16. EKG confirms NSR in the 70s, incomplete RBBB and rightward axis. Echocardiogram was performed (read pending).   He was discharged on eliquis and lopressor. Hold ACEI and ASA.  HTN Hold home medication ramipril. Instructed to take BP at home. Evaluate at OP clinic.   DM Continue home PO medications.  HLD Continue lipitor. No lipid panel in EPIC. Consider repeating or ensure his levels have been checked by PCP.  Patient seen and examined by Dr. Royann Shivers today and was stable for discharge. All follow up has been arranged.  _____________  Discharge Vitals Blood pressure 130/79, pulse 74, temperature 98.5 F (36.9 C), temperature source Oral, resp. rate 17, SpO2 100 %.  There were no vitals filed for this visit.  Labs & Radiologic Studies    CBC  Recent Labs  12/29/16 1327 12/29/16 1449  WBC 6.4  --   HGB 16.4 16.7  HCT 47.7 49.0  MCV 93.3  --   PLT 245  --    Basic Metabolic Panel  Recent Labs  12/29/16 1327 12/29/16 1449 12/29/16 1636  NA 137 139  --   K 4.7 4.6  --   CL 102 101  --   CO2 26  --   --   GLUCOSE 154* 143*  --   BUN 15 19  --   CREATININE 1.01 0.90  --   CALCIUM 9.7  --   --   MG  --   --  1.9   Liver Function Tests No results for input(s): AST, ALT, ALKPHOS, BILITOT, PROT, ALBUMIN in the last 72 hours. No results for input(s): LIPASE, AMYLASE in the last 72 hours. Cardiac Enzymes No results for input(s): CKTOTAL, CKMB, CKMBINDEX, TROPONINI in the last 72 hours. BNP Invalid input(s): POCBNP D-Dimer No results for input(s): DDIMER in the last 72 hours. Hemoglobin A1C No results for input(s): HGBA1C in the last 72 hours. Fasting Lipid Panel No results for input(s): CHOL, HDL, LDLCALC, TRIG, CHOLHDL, LDLDIRECT in the last 72 hours. Thyroid Function Tests  Recent Labs  12/29/16 1709  TSH 1.909   _____________  No results found.   Diagnostic Studies/Procedures      Echocardiogram 12/30/16: completed, read pending  Disposition   Pt is being discharged home today in good condition.  Follow-up Plans & Appointments     Discharge Instructions    Amb referral to [facility]    Complete by:  As directed    Diet - low sodium heart healthy    Complete by:  As directed    Increase activity slowly    Complete by:  As directed       Discharge Medications   Current Discharge Medication List    START taking these medications   Details  apixaban (ELIQUIS) 5 MG TABS tablet Take 1 tablet (5 mg total) by mouth 2 (two) times daily. Qty: 60 tablet, Refills: 11    metoprolol tartrate (LOPRESSOR) 25 MG tablet Take 1 tablet (25 mg total) by mouth 2 (two) times daily. Qty: 60 tablet, Refills: 11      CONTINUE these medications which have NOT CHANGED   Details  Albiglutide (TANZEUM) 50 MG PEN Inject 50 mg into the skin once a week.    atorvastatin (LIPITOR) 40 MG tablet Take 40 mg by mouth daily.     cholecalciferol (VITAMIN D) 1000 units tablet Take 1,000 Units by mouth daily.    glimepiride (AMARYL) 4 MG tablet Take 4 mg by mouth daily.    INVOKANA 300 MG TABS Take 300 mg by mouth daily before breakfast.     pioglitazone (ACTOS) 45 MG tablet Take 45 mg by mouth daily.     acetaminophen (TYLENOL) 325 MG tablet Take 2 tablets (650 mg total) by mouth every 6 (six) hours as needed for mild pain or moderate pain. Qty: 60 tablet, Refills: 0    Diclofenac Sodium 3 % GEL Place 1 application onto the skin 2 (two) times daily. To affected area. Qty: 100 g, Refills: 0    methocarbamol (ROBAXIN) 500 MG tablet Take 1 tablet (500 mg total) by mouth 2 (two) times daily as needed for muscle spasms. Qty: 20 tablet, Refills: 0      STOP taking these medications     aspirin 81 MG tablet      ibuprofen (ADVIL,MOTRIN) 200 MG tablet      ramipril (ALTACE) 5 MG capsule           Outstanding Labs/Studies   Sent staff message for follow up appts with EP  and with APP on Dr. Erin Hearing team for TCM in 7-10 days.  Duration of Discharge Encounter   Greater than 30 minutes including physician time.  Signed, Roe Rutherford Kalesha Irving PA-C 12/30/2016, 5:29 PM

## 2016-12-30 NOTE — Discharge Instructions (Signed)
Atrial Flutter °Atrial flutter is a type of abnormal heart rhythm (arrhythmia). In atrial flutter, the heartbeat is fast but regular. There are two types of atrial flutter: °· Paroxysmal atrial flutter. This type starts suddenly. It usually stops on its own soon after it starts. °· Permanent atrial flutter. This type does not go away. ° °What are the causes? °This condition may be caused by: °· A heart condition or problem, such as: °? A heart attack. °? Heart failure. °? A heart valve problem. °· A lung problem, such as: °? A blood clot in the lungs (pulmonary embolism, or PE). °? Chronic obstructive pulmonary disease. °· Poorly controlled high blood pressure (hypertension). °· Hyperthyroidism. °· Caffeine. °· Some decongestant cold medicines. °· Low levels of minerals called electrolytes in the blood. °· Cocaine. ° °What increases the risk? °This condition is more likely to develop in: °· Elderly adults. °· Men. ° °What are the signs or symptoms? °Symptoms of this condition include: °· A feeling that your heart is pounding or racing (palpitations). °· Shortness of breath. °· Chest pain. °· Feeling light-headed. °· Dizziness. °· Fainting. ° °How is this diagnosed? °This condition may be diagnosed with tests, including: °· An electrocardiogram (ECG). This is a painless test that records electrical signals in the heart. °· Holter monitoring. For this test, you wear a device that records your heartbeat for 1-2 days. °· Cardiac event monitoring. For this test, you wear a device that records your heartbeat for up to 30 days. °· An echocardiogram. This is a painless test that uses sound waves to make a picture of your heart. °· Stress test. This test records your heartbeat while you exercise. °· Blood tests. ° °How is this treated? °This condition may be treated with: °· Treatment of any underlying conditions. °· Medicine to make your heart beat more slowly. °· Medicine to keep the condition from coming back. °· A  procedure to keep the condition under control. Some procedures to do this include: °? Cardioversion. During this procedure, medicines or an electrical shock are given to make the heart beat normally. °? Ablation. During this procedure, the heart tissue that is causing the problem is destroyed. This procedure may be done if atrial flutter lasts a long time or happens often. ° °Follow these instructions at home: °· Take over-the-counter and prescription medicines only as told by your health care provider. °· Do not take any new medicines without talking to your health care provider. °· Do not use tobacco products, including cigarettes, chewing tobacco, or e-cigarettes. If you need help quitting, ask your health care provider. °· Limit alcohol intake to no more than 1 drink per day for nonpregnant women and 2 drinks per day for men. One drink equals 12 oz of beer, 5 oz of wine, or 1½ oz of hard liquor. °· Try to reduce any stress. Stress can make your symptoms worse. °Contact a health care provider if: °· Your symptoms get worse. °Get help right away if: °· You are dizzy. °· You feel like fainting or you faint. °· You have shortness of breath. °· You feel pain or pressure in your chest. °· You suddenly feel nauseous or you suddenly vomit. °· There is a sudden change in your ability to speak, eat, or move. °· You are sweating a lot for no reason. °This information is not intended to replace advice given to you by your health care provider. Make sure you discuss any questions you have with your health care   provider. Document Released: 08/21/2008 Document Revised: 08/12/2015 Document Reviewed: 10/17/2014 Elsevier Interactive Patient Education  2018 [employer] Apixaban oral tablets What is this medicine? APIXABAN (a PIX a ban) is an anticoagulant (blood thinner). It is used to lower the chance of stroke in people with a medical condition called atrial fibrillation. It is also used to treat or prevent blood clots  in the lungs or in the veins. This medicine may be used for other purposes; ask your health care provider or pharmacist if you have questions. COMMON BRAND NAME(S): Eliquis What should I tell my health care provider before I take this medicine? They need to know if you have any of these conditions: -bleeding disorders -bleeding in the brain -blood in your stools (black or tarry stools) or if you have blood in your vomit -history of stomach bleeding -kidney disease -liver disease -mechanical heart valve -an unusual or allergic reaction to apixaban, other medicines, foods, dyes, or preservatives -pregnant or trying to get pregnant -breast-feeding How should I use this medicine? Take this medicine by mouth with a glass of water. Follow the directions on the prescription label. You can take it with or without food. If it upsets your stomach, take it with food. Take your medicine at regular intervals. Do not take it more often than directed. Do not stop taking except on your doctor's advice. Stopping this medicine may increase your risk of a blot clot. Be sure to refill your prescription before you run out of medicine. Talk to your pediatrician regarding the use of this medicine in children. Special care may be needed. Overdosage: If you think you have taken too much of this medicine contact a poison control center or emergency room at once. NOTE: This medicine is only for you. Do not share this medicine with others. What if I miss a dose? If you miss a dose, take it as soon as you can. If it is almost time for your next dose, take only that dose. Do not take double or extra doses. What may interact with this medicine? This medicine may interact with the following: -aspirin and aspirin-like medicines -certain medicines for fungal infections like ketoconazole and itraconazole -certain medicines for seizures like carbamazepine and phenytoin -certain medicines that treat or prevent blood clots  like warfarin, enoxaparin, and dalteparin -clarithromycin -NSAIDs, medicines for pain and inflammation, like ibuprofen or naproxen -rifampin -ritonavir -St. John's wort This list may not describe all possible interactions. Give your health care provider a list of all the medicines, herbs, non-prescription drugs, or dietary supplements you use. Also tell them if you smoke, drink alcohol, or use illegal drugs. Some items may interact with your medicine. What should I watch for while using this medicine? Visit your doctor or health care professional for regular checks on your progress. Notify your doctor or health care professional and seek emergency treatment if you develop breathing problems; changes in vision; chest pain; severe, sudden headache; pain, swelling, warmth in the leg; trouble speaking; sudden numbness or weakness of the face, arm or leg. These can be signs that your condition has gotten worse. If you are going to have surgery or other procedure, tell your doctor that you are taking this medicine. What side effects may I notice from receiving this medicine? Side effects that you should report to your doctor or health care professional as soon as possible: -allergic reactions like skin rash, itching or hives, swelling of the face, lips, or tongue -signs and symptoms of bleeding such as  bloody or black, tarry stools; red or dark-brown urine; spitting up blood or brown material that looks like coffee grounds; red spots on the skin; unusual bruising or bleeding from the eye, gums, or nose This list may not describe all possible side effects. Call your doctor for medical advice about side effects. You may report side effects to FDA at 1-800-FDA-1088. Where should I keep my medicine? Keep out of the reach of children. Store at room temperature between 20 and 25 degrees C (68 and 77 degrees F). Throw away any unused medicine after the expiration date. NOTE: This sheet is a summary. It may not  cover all possible information. If you have questions about this medicine, talk to your doctor, pharmacist, or health care provider.  2018 Elsevier/Gold Standard (2015-10-26 11:54:23)

## 2016-12-30 NOTE — ED Notes (Signed)
Admitting dr paged regarding diabetic meds not being ordered

## 2016-12-30 NOTE — Progress Notes (Signed)
  Echocardiogram 2D Echocardiogram has been performed.  Delcie Roch 12/30/2016, 5:27 PM

## 2016-12-30 NOTE — ED Notes (Addendum)
Attempted to call report x2

## 2016-12-30 NOTE — ED Notes (Signed)
Cardiology repaged due to no response

## 2016-12-30 NOTE — ED Notes (Signed)
Lunch tray delivered.

## 2016-12-30 NOTE — ED Notes (Signed)
Tray ordered.

## 2016-12-30 NOTE — ED Notes (Signed)
Attempted to call report. Advised the pt has not been placed with a nurse and to call back

## 2016-12-30 NOTE — ED Notes (Signed)
Spoke with cardiology regarding BP: per provider to start a bolus and keep pt off of cardizem drip.

## 2016-12-30 NOTE — ED Notes (Signed)
Cardizem stopped at this time. IV flushed. Cardiology paged due to BP.

## 2016-12-30 NOTE — Progress Notes (Signed)
   Progress Note  Patient Name: Thomas Juarez Date of Encounter: 12/30/2016  Primary Cardiologist: New  Subjective   Converted to SR at 6AM. Asymptomatic.  Inpatient Medications    Scheduled Meds: . apixaban  5 mg Oral BID  . aspirin  81 mg Oral Daily  . atorvastatin  40 mg Oral Daily  . insulin aspart  0-15 Units Subcutaneous TID WC  . metoprolol tartrate  25 mg Oral BID  . sodium chloride flush  3 mL Intravenous Q12H   Continuous Infusions: . sodium chloride     PRN Meds: sodium chloride, acetaminophen, ondansetron (ZOFRAN) IV, sodium chloride flush, zolpidem   Vital Signs    Vitals:   12/30/16 0945 12/30/16 1007 12/30/16 1213 12/30/16 1343  BP: 118/75 136/79 125/80 130/79  Pulse: 90 79 74 74  Resp:  Temp:    98.5 F (36.9 C)  TempSrc:    Oral  SpO2:  100% 100%     Intake/Output Summary (Last 24 hours) at 12/30/16 1642 Last data filed at 12/30/16 0528  Gross per 24 hour  Intake              200 ml  Output              800 ml  Net             -600 ml   There were no vitals filed for this visit.  Telemetry    NSR - Personally Reviewed  ECG    pending - Personally Reviewed  Physical Exam  Comfortable GEN: No acute distress.   Neck: No JVD Cardiac: RRR, no murmurs, rubs, or gallops.  Respiratory: Clear to auscultation bilaterally. GI: Soft, nontender, non-distended  MS: No edema; No deformity. Neuro:  Nonfocal  Psych: Normal affect   Labs    Chemistry Recent Labs Lab 12/29/16 1327 12/29/16 1449  NA 137 139  K 4.7 4.6  CL 102 101  CO2 26  --   GLUCOSE 154* 143*  BUN 15 19  CREATININE 1.01 0.90  CALCIUM 9.7  --   GFRNONAA >60  --   GFRAA >60  --   ANIONGAP 9  --      Hematology Recent Labs Lab 12/29/16 1327 12/29/16 1449  WBC 6.4  --   RBC 5.11  --   HGB 16.4 16.7  HCT 47.7 49.0  MCV 93.3  --   MCH 32.1  --   MCHC 34.4  --   RDW 13.8  --   PLT 245  --     Cardiac EnzymesNo results for input(s):  TROPONINI in the last 168 hours. No results for input(s): TROPIPOC in the last 168 hours.   BNPNo results for input(s): BNP, PROBNP in the last 168 hours.   DDimer No results for input(s): DDIMER in the last 168 hours.   Radiology    No results found.  Cardiac Studies   Echo and repeat ecg pending  Patient Profile     56 y.o. male with incidentally discovered atrial flutter, HTN and DM, spontaneously converted to NSR.  Assessment & Plan    Will DC home after echo. Continue anticoagulation and metoprolol. Refer to EP to discuss ablation.  For questions or updates, please contact [facility] Please consult [email or web address] for contact info under Cardiology/STEMI.      Signed, Thurmon Fair, MD  12/30/2016, 4:42 PM

## 2017-01-02 SURGERY — ECHOCARDIOGRAM, TRANSESOPHAGEAL
Anesthesia: Moderate Sedation

## 2017-01-05 ENCOUNTER — Other Ambulatory Visit: Payer: Self-pay | Admitting: Cardiology

## 2017-01-05 ENCOUNTER — Encounter: Payer: Self-pay | Admitting: Cardiology

## 2017-01-05 ENCOUNTER — Encounter: Payer: Self-pay | Admitting: *Deleted

## 2017-01-05 ENCOUNTER — Ambulatory Visit (INDEPENDENT_AMBULATORY_CARE_PROVIDER_SITE_OTHER): Payer: 59 | Admitting: Cardiology

## 2017-01-05 ENCOUNTER — Encounter (INDEPENDENT_AMBULATORY_CARE_PROVIDER_SITE_OTHER): Payer: Self-pay

## 2017-01-05 VITALS — BP 130/86 | HR 70 | Ht 71.0 in | Wt 216.2 lb

## 2017-01-05 DIAGNOSIS — I4892 Unspecified atrial flutter: Secondary | ICD-10-CM

## 2017-01-05 DIAGNOSIS — I10 Essential (primary) hypertension: Secondary | ICD-10-CM | POA: Diagnosis not present

## 2017-01-05 NOTE — Patient Instructions (Addendum)
Medication Instructions:  Your physician recommends that you continue on your current medications as directed. Please refer to the Current Medication list given to you today.  -- If you need a refill on your cardiac medications before your next appointment, please call your pharmacy. --  Labwork: None ordered  Testing/Procedures: Your physician has recommended that you have an Atrial Flutter ablation. Catheter ablation is a medical procedure used to treat some cardiac arrhythmias (irregular heartbeats). During catheter ablation, a long, thin, flexible tube is put into a blood vessel in your groin (upper thigh), or neck. This tube is called an ablation catheter. It is then guided to your heart through the blood vessel. Radio frequency waves destroy small areas of heart tissue where abnormal heartbeats may cause an arrhythmia to start. Please see the instruction sheet given to you today.  Follow-Up: Your physician recommends that you schedule a follow-up appointment with Dr. Elberta Fortis between 10/12 - 10/24.  Your physician recommends that you schedule a follow-up appointment in: 4 weeks, after your procedure on 02/10/2017, with Dr. Elberta Fortis.  Thank you for choosing [facility]!!   Dory Horn, RN [phone]  Any Other Special Instructions Will Be Listed Below (If Applicable).   Cardiac Ablation Cardiac ablation is a procedure to disable (ablate) a small amount of heart tissue in very specific places. The heart has many electrical connections. Sometimes these connections are abnormal and can cause the heart to beat very fast or irregularly. Ablating some of the problem areas can improve the heart rhythm or return it to normal. Ablation may be done for people who:  Have Wolff-Parkinson-White syndrome.  Have fast heart rhythms (tachycardia).  Have taken medicines for an abnormal heart rhythm (arrhythmia) that were not effective or caused side effects.  Have a high-risk heartbeat  that may be life-threatening.  During the procedure, a small incision is made in the neck or the groin, and a long, thin, flexible tube (catheter) is inserted into the incision and moved to the heart. Small devices (electrodes) on the tip of the catheter will send out electrical currents. A type of X-ray (fluoroscopy) will be used to help guide the catheter and to provide images of the heart. Tell a health care provider about:  Any allergies you have.  All medicines you are taking, including vitamins, herbs, eye drops, creams, and over-the-counter medicines.  Any problems you or family members have had with anesthetic medicines.  Any blood disorders you have.  Any surgeries you have had.  Any medical conditions you have, such as kidney failure.  Whether you are pregnant or may be pregnant. What are the risks? Generally, this is a safe procedure. However, problems may occur, including:  Infection.  Bruising and bleeding at the catheter insertion site.  Bleeding into the chest, especially into the sac that surrounds the heart. This is a serious complication.  Stroke or blood clots.  Damage to other structures or organs.  Allergic reaction to medicines or dyes.  Need for a permanent pacemaker if the normal electrical system is damaged. A pacemaker is a small computer that sends electrical signals to the heart and helps your heart beat normally.  The procedure not being fully effective. This may not be recognized until months later. Repeat ablation procedures are sometimes required.  What happens before the procedure?  Follow instructions from your health care provider about eating or drinking restrictions.  Ask your health care provider about: ? Changing or stopping your regular medicines. This is especially important  if you are taking diabetes medicines or blood thinners. ? Taking medicines such as aspirin and ibuprofen. These medicines can thin your blood. Do not take these  medicines before your procedure if your health care provider instructs you not to.  Plan to have someone take you home from the hospital or clinic.  If you will be going home right after the procedure, plan to have someone with you for 24 hours. What happens during the procedure?  To lower your risk of infection: ? Your health care team will wash or sanitize their hands. ? Your skin will be washed with soap. ? Hair may be removed from the incision area.  An IV tube will be inserted into one of your veins.  You will be given a medicine to help you relax (sedative).  The skin on your neck or groin will be numbed.  An incision will be made in your neck or your groin.  A needle will be inserted through the incision and into a large vein in your neck or groin.  A catheter will be inserted into the needle and moved to your heart.  Dye may be injected through the catheter to help your surgeon see the area of the heart that needs treatment.  Electrical currents will be sent from the catheter to ablate heart tissue in desired areas. There are three types of energy that may be used to ablate heart tissue: ? Heat (radiofrequency energy). ? Laser energy. ? Extreme cold (cryoablation).  When the necessary tissue has been ablated, the catheter will be removed.  Pressure will be held on the catheter insertion area to prevent excessive bleeding.  A bandage (dressing) will be placed over the catheter insertion area. The procedure may vary among health care providers and hospitals. What happens after the procedure?  Your blood pressure, heart rate, breathing rate, and blood oxygen level will be monitored until the medicines you were given have worn off.  Your catheter insertion area will be monitored for bleeding. You will need to lie still for a few hours to ensure that you do not bleed from the catheter insertion area.  Do not drive for 24 hours or as long as directed by your health care  provider. Summary  Cardiac ablation is a procedure to disable (ablate) a small amount of heart tissue in very specific places. Ablating some of the problem areas can improve the heart rhythm or return it to normal.  During the procedure, electrical currents will be sent from the catheter to ablate heart tissue in desired areas. This information is not intended to replace advice given to you by your health care provider. Make sure you discuss any questions you have with your health care provider. Document Released: 08/21/2008 Document Revised: 02/22/2016 Document Reviewed: 02/22/2016 Elsevier Interactive Patient Education  [employer].

## 2017-01-05 NOTE — Progress Notes (Signed)
Electrophysiology Office Note   Date:  01/05/2017   ID:  Juarez, Thomas 22-Feb-1961, MRN [id number]  PCP:  Iva Boop, MD  Cardiologist:  [facility] Primary Electrophysiologist:  Amadeo Coke Jorja Loa, MD    Chief Complaint  Patient presents with  . Advice Only    Discuss Aflutter ablation     History of Present Illness: Thomas Juarez is a 56 y.o. male who is being seen today for the evaluation of atrial flutter at the request of Via, Caryn Bee, MD. Presenting today for electrophysiology evaluation. He has a history of diabetes and hypertension. He presented to the emergency room on 12/29/16 with atrial flutter. The day of admission, his daughter noted his heart rate was high. He went to his primary physician's and was noted to be in atrial flutter with rapid rates. His heart rate in the emergency room with 154 bpm. He converted to sinus rhythm while in the hospital and was thus discharged.    Today, he denies symptoms of palpitations, chest pain, shortness of breath, orthopnea, PND, lower extremity edema, claudication, dizziness, presyncope, syncope, bleeding, or neurologic sequela. The patient is tolerating medications without difficulties. He was unaware of his atrial flutter on diagnosis. He is currently feeling well without major complaint.   Past Medical History:  Diagnosis Date  . Diabetes mellitus without complication (HCC)   . Hypertension    Past Surgical History:  Procedure Laterality Date  . HERNIA REPAIR  1988  . KNEE SURGERY Bilateral 2005, 2007  . TONSILLECTOMY       Current Outpatient Prescriptions  Medication Sig Dispense Refill  . acetaminophen (TYLENOL) 325 MG tablet Take 2 tablets (650 mg total) by mouth every 6 (six) hours as needed for mild pain or moderate pain. 60 tablet 0  . Albiglutide (TANZEUM) 50 MG PEN Inject 50 mg into the skin once a week.    Marland Kitchen apixaban (ELIQUIS) 5 MG TABS tablet Take 1 tablet (5 mg total) by mouth 2 (two) times daily. 60  tablet 11  . atorvastatin (LIPITOR) 40 MG tablet Take 40 mg by mouth daily.     . cholecalciferol (VITAMIN D) 1000 units tablet Take 1,000 Units by mouth daily.    Marland Kitchen glimepiride (AMARYL) 4 MG tablet Take 4 mg by mouth daily.    . INVOKANA 300 MG TABS Take 300 mg by mouth daily before breakfast.     . metoprolol tartrate (LOPRESSOR) 25 MG tablet Take 1 tablet (25 mg total) by mouth 2 (two) times daily. 60 tablet 11  . pioglitazone (ACTOS) 45 MG tablet Take 45 mg by mouth daily.      No current facility-administered medications for this visit.     Allergies:   Metformin   Social History:  The patient  reports that he has never smoked. He has never used smokeless tobacco. He reports that he drinks alcohol. He reports that he does not use drugs.   Family History:  The patient's family history includes Diabetes in his brother and mother; Hypertension in his father.    ROS:  Please see the history of present illness.   Otherwise, review of systems is positive for palpitations.   All other systems are reviewed and negative.    PHYSICAL EXAM: VS:  BP 130/86   Pulse 70   Ht  (1.803 m)   Wt 216 lb 3.2 oz (98.1 kg)   SpO2 95%   BMI 30.15 kg/m  , BMI Body mass index is 30.15 kg/m.  GEN: Well nourished, well developed, in no acute distress  HEENT: normal  Neck: no JVD, carotid bruits, or masses Cardiac: RRR; no murmurs, rubs, or gallops,no edema  Respiratory:  clear to auscultation bilaterally, normal work of breathing GI: soft, nontender, nondistended, + BS MS: no deformity or atrophy  Skin: warm and dry Neuro:  Strength and sensation are intact Psych: euthymic mood, full affect  EKG:  EKG is not ordered today. Personal review of the ekg ordered 01/01/17 shows atrial flutter, PVC, rate 91  Recent Labs: 12/29/2016: BUN 19; Creatinine, Ser 0.90; Hemoglobin 16.7; Magnesium 1.9; Platelets 245; Potassium 4.6; Sodium 139; TSH 1.909    Lipid Panel  No results found for: CHOL, TRIG,  HDL, CHOLHDL, VLDL, LDLCALC, LDLDIRECT   Wt Readings from Last 3 Encounters:  01/05/17 216 lb 3.2 oz (98.1 kg)  07/30/16 201 lb (91.2 kg)  01/17/14 204 lb (92.5 kg)      Other studies Reviewed: Additional studies/ records that were reviewed today include: TTE 12/30/16  Review of the above records today demonstrates:  - Left ventricle: The cavity size was normal. Systolic function was   normal. The estimated ejection fraction was in the range of 55%   to 60%. Wall motion was normal; there were no regional wall   motion abnormalities. Left ventricular diastolic function   parameters were normal. - Aortic valve: There was trivial regurgitation. - Mitral valve: Mild focal calcification of the anterior leaflet. - Atrial septum: No defect or patent foramen ovale was identified.   ASSESSMENT AND PLAN:  1.  Paroxysmal atrial flutter: Converted to sinus rhythm while in the hospital. Is on Eliquis for anticoagulation. Discussed the risks and benefits of ablation. Risks include bleeding, tamponade, heart block, and stroke. He understands these risks and has agreed to the procedure.  This patients CHA2DS2-VASc Score and unadjusted Ischemic Stroke Rate (% per year) is equal to 2.2 % stroke rate/year from a score of 2  Above score calculated as 1 point each if present [CHF, HTN, DM, Vascular=MI/PAD/Aortic Plaque, Age if 65-74, or Male] Above score calculated as 2 points each if present [Age > 75, or Stroke/TIA/TE]  2. Hypertension: Well-controlled today. No changes.    Current medicines are reviewed at length with the patient today.   The patient does not have concerns regarding his medicines.  The following changes were made today:  none  Labs/ tests ordered today include:  No orders of the defined types were placed in this encounter.    Disposition:   FU with Trevia Nop 1 months  Signed, Betzalel Umbarger Jorja Loa, MD  01/05/2017 3:39 PM     [facility] [address] [place] [address] [phone] (office) [phone] (fax)

## 2017-01-06 NOTE — Progress Notes (Signed)
Thanks, Will. MCr 

## 2017-01-10 ENCOUNTER — Telehealth: Payer: Self-pay | Admitting: Cardiology

## 2017-01-10 NOTE — Telephone Encounter (Signed)
Pt c/o medication issue:  1. Name of Medication: metoprolol  2. How are you currently taking this medication (dosage and times per day)?   2X DAY  3. Are you having a reaction (difficulty breathing--STAT)? no 4. What is your medication issue? After pt takes medication his HR drops to 39

## 2017-01-10 NOTE — Telephone Encounter (Signed)
Reviewed with Dr Bing Plume metoprolol to 12.5 mg two times a day, do not take if pulse 50 or less.  Pt advised, expressed verbal understanding.  I will forward to Dr Elberta Fortis for review.

## 2017-01-10 NOTE — Telephone Encounter (Signed)
Pt does note some fatigue/tiredness, denies lightheadedness/ dizziness.

## 2017-01-16 ENCOUNTER — Telehealth: Payer: Self-pay | Admitting: Cardiology

## 2017-01-16 ENCOUNTER — Ambulatory Visit (INDEPENDENT_AMBULATORY_CARE_PROVIDER_SITE_OTHER): Payer: 59 | Admitting: Cardiology

## 2017-01-16 ENCOUNTER — Encounter: Payer: Self-pay | Admitting: Cardiology

## 2017-01-16 DIAGNOSIS — I10 Essential (primary) hypertension: Secondary | ICD-10-CM | POA: Insufficient documentation

## 2017-01-16 DIAGNOSIS — E785 Hyperlipidemia, unspecified: Secondary | ICD-10-CM | POA: Diagnosis not present

## 2017-01-16 DIAGNOSIS — Z7901 Long term (current) use of anticoagulants: Secondary | ICD-10-CM | POA: Diagnosis not present

## 2017-01-16 DIAGNOSIS — I483 Typical atrial flutter: Secondary | ICD-10-CM | POA: Diagnosis not present

## 2017-01-16 DIAGNOSIS — E119 Type 2 diabetes mellitus without complications: Secondary | ICD-10-CM

## 2017-01-16 NOTE — Telephone Encounter (Signed)
New Message     Pt would like to speak about the pre procedure that is schedule for 02/08/17

## 2017-01-16 NOTE — Telephone Encounter (Signed)
Pt verifying H&P appt w/ Camnitz.  Informed he is scheduled on 10/24 @ 4:15pm in the [facility] office. Patient verbalized understanding and agreeable to plan.

## 2017-01-16 NOTE — Assessment & Plan Note (Signed)
On statin Rx 

## 2017-01-16 NOTE — Patient Instructions (Addendum)
Luke Kilroy, PA-C, recommends that you schedule a follow-up appointment in 6 months with Dr Croitoru. You will receive a reminder letter in the mail two months in advance. If you don't receive a letter, please call our office to schedule the follow-up appointment.  If you need a refill on your cardiac medications before your next appointment, please call your pharmacy. 

## 2017-01-16 NOTE — Assessment & Plan Note (Signed)
Type 2 NIDDM 

## 2017-01-16 NOTE — Assessment & Plan Note (Signed)
Pt was asymptomatic-plan is for RFA

## 2017-01-16 NOTE — Assessment & Plan Note (Signed)
CHADS VASC=2 for HTN and DM- on Eliquis
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
## 2017-01-16 NOTE — Assessment & Plan Note (Signed)
Controlled- on no medications for this

## 2017-01-19 ENCOUNTER — Telehealth: Payer: Self-pay | Admitting: Cardiology

## 2017-01-19 MED ORDER — METOPROLOL TARTRATE 25 MG PO TABS: 25.0000 mg | ORAL_TABLET | Freq: Two times a day (BID) | ORAL | 2 refills | Status: DC

## 2017-01-19 NOTE — Telephone Encounter (Signed)
STAT if HR is under 50 or over 120 (normal HR is 60-100 beats per minute)  1) What is your heart rate? 140   150  Do you have a log of your heart rate readings (document readings)? 140    150 2) Do you have any other symptoms? No symptoms

## 2017-01-19 NOTE — Telephone Encounter (Signed)
I would increase his Metoprolol back to 25 mg twice a day, OK to take an extra 12.5 mg if needed for tachycardia. He is going to have problems with this until he gets his ablation. If he become uncomfortable or has syncope he should go to the ED.  Corine Shelter PA-C 01/19/2017 3:27 PM ,

## 2017-01-19 NOTE — Telephone Encounter (Signed)
Spoke with Misty Stanley, patient's wife. She reports patient is having high HR. He is scheduled to have a cardiac ablation. She states he took the trash out and he was diaphoretic and HR was 155bpm but he felt fine. He had not yet taken his meds. He then took meds as prescribed, drank water, rested and HR came down to 90. She states every time he sits up, his HR goes up to 140-150 and is normal when lying. She states he is "scared" as this is a change. He took an extra 12.5mg  of metoprolol tartrate this AM.   Advised would need to defer to [facility], [place] who saw patient on 10/1 and Dr. Elberta Fortis who is patient's EP MD for advice

## 2017-01-19 NOTE — Telephone Encounter (Signed)
Patient's wife aware of PA recommendations. Rx(s) sent to pharmacy electronically. She asked if there were any cancellations for ablation procedure, patient would like to be notified. He is retired and has a flexible schedule and would like this done ASAP

## 2017-01-19 NOTE — Telephone Encounter (Signed)
Advised wife to have patient monitor home BP & HR 1-2 hours after taking his meds each day to ensure he does not become bradycardic

## 2017-01-20 NOTE — Telephone Encounter (Signed)
Notified patient's wife that Dory Horn, RN is aware that patient would like procedure done sooner if possible.

## 2017-01-20 NOTE — Telephone Encounter (Signed)
F/u message  Pt wife wants to proceed with making pts procedure at a sooner date. Please call back to  Discuss

## 2017-01-24 NOTE — Telephone Encounter (Signed)
Informed pt no sooner ablation date available at this time.  Advised I would call him if sooner time became unavailable (and informed that most unlikely to occur).  Pt verbalized understanding and thanks me for letting him know.

## 2017-02-08 ENCOUNTER — Ambulatory Visit (INDEPENDENT_AMBULATORY_CARE_PROVIDER_SITE_OTHER): Payer: 59 | Admitting: Cardiology

## 2017-02-08 ENCOUNTER — Encounter: Payer: Self-pay | Admitting: Cardiology

## 2017-02-08 VITALS — BP 144/85 | HR 73 | Ht 71.0 in | Wt 216.8 lb

## 2017-02-08 DIAGNOSIS — I10 Essential (primary) hypertension: Secondary | ICD-10-CM

## 2017-02-08 DIAGNOSIS — Z01812 Encounter for preprocedural laboratory examination: Secondary | ICD-10-CM | POA: Diagnosis not present

## 2017-02-08 DIAGNOSIS — I483 Typical atrial flutter: Secondary | ICD-10-CM | POA: Diagnosis not present

## 2017-02-08 NOTE — Progress Notes (Signed)
Electrophysiology Office Note   Date:  02/08/2017   ID:  Thomas Kendallommy Darren Desena, DOB 08/20/1960, MRN [id number]  PCP:  Iva BoopVia, Kevin, MD  Cardiologist:  [place] Primary Electrophysiologist:  Erynn Vaca Jorja LoaMartin Ladeja Pelham, MD    No chief complaint on file.    History of Present Illness: Thomas Juarez is a 56 y.o. male who is being seen today for the evaluation of atrial flutter at the request of Via, Caryn BeeKevin, MD. Presenting today for electrophysiology evaluation. He has a history of diabetes and hypertension. He presented to the emergency room on 12/29/16 with atrial flutter. The day of admission, his daughter noted his heart rate was high. He went to his primary physician's and was noted to be in atrial flutter with rapid rates. His heart rate in the emergency room with 154 bpm. He converted to sinus rhythm while in the hospital and was thus discharged. He is planned for atrial flutter ablation on 02/10/17.  Today, denies symptoms of palpitations, chest pain, shortness of breath, orthopnea, PND, lower extremity edema, claudication, dizziness, presyncope, syncope, bleeding, or neurologic sequela. The patient is tolerating medications without difficulties. He is feeling well today without complaint. He has not had any further episodes of palpitations, weakness, or shortness of breath.    Past Medical History:  Diagnosis Date  . Diabetes mellitus without complication (HCC)   . Hypertension    Past Surgical History:  Procedure Laterality Date  . HERNIA REPAIR  1988  . KNEE SURGERY Bilateral 2005, 2007  . TONSILLECTOMY       Current Outpatient Prescriptions  Medication Sig Dispense Refill  . acetaminophen (TYLENOL) 325 MG tablet Take 2 tablets (650 mg total) by mouth every 6 (six) hours as needed for mild pain or moderate pain. 60 tablet 0  . Albiglutide (TANZEUM) 50 MG PEN Inject 50 mg into the skin once a week.    Marland Kitchen. apixaban (ELIQUIS) 5 MG TABS tablet Take 1 tablet (5 mg total) by mouth 2  (two) times daily. 60 tablet 11  . atorvastatin (LIPITOR) 40 MG tablet Take 40 mg by mouth daily.     . cholecalciferol (VITAMIN D) 1000 units tablet Take 1,000 Units by mouth daily.    Marland Kitchen. glimepiride (AMARYL) 4 MG tablet Take 4 mg by mouth daily.    . INVOKANA 300 MG TABS Take 300 mg by mouth daily before breakfast.     . metoprolol tartrate (LOPRESSOR) 25 MG tablet Take 1 tablet (25 mg total) by mouth 2 (two) times daily. May take extra half tablet as needed for high heart rate 90 tablet 2  . pioglitazone (ACTOS) 45 MG tablet Take 45 mg by mouth daily.     . TRULICITY 1.5 MG/0.5ML SOPN as directed.     No current facility-administered medications for this visit.     Allergies:   Metformin   Social History:  The patient  reports that he has never smoked. He has never used smokeless tobacco. He reports that he drinks alcohol. He reports that he does not use drugs.   Family History:  The patient's family history includes Diabetes in his brother and mother; Hypertension in his father.    ROS:  Please see the history of present illness.   Otherwise, review of systems is positive for none.   All other systems are reviewed and negative.   PHYSICAL EXAM: VS:  BP (!) 144/85   Pulse 73   Ht 5\' 11"  (1.803 m)   Wt 216 lb 12.8 oz (  98.3 kg)   BMI 30.24 kg/m  , BMI Body mass index is 30.24 kg/m. GEN: Well nourished, well developed, in no acute distress  HEENT: normal  Neck: no JVD, carotid bruits, or masses Cardiac: RRR; no murmurs, rubs, or gallops,no edema  Respiratory:  clear to auscultation bilaterally, normal work of breathing GI: soft, nontender, nondistended, + BS MS: no deformity or atrophy  Skin: warm and dry Neuro:  Strength and sensation are intact Psych: euthymic mood, full affect  EKG:  EKG is not ordered today. Personal review of the ekg ordered 01/16/17 shows sinus rhythm, rate 59  Recent Labs: 12/29/2016: BUN 19; Creatinine, Ser 0.90; Hemoglobin 16.7; Magnesium 1.9;  Platelets 245; Potassium 4.6; Sodium 139; TSH 1.909    Lipid Panel  No results found for: CHOL, TRIG, HDL, CHOLHDL, VLDL, LDLCALC, LDLDIRECT   Wt Readings from Last 3 Encounters:  02/08/17 216 lb 12.8 oz (98.3 kg)  01/16/17 213 lb 3.2 oz (96.7 kg)  01/05/17 216 lb 3.2 oz (98.1 kg)      Other studies Reviewed: Additional studies/ records that were reviewed today include: TTE 12/30/16  Review of the above records today demonstrates:  - Left ventricle: The cavity size was normal. Systolic function was   normal. The estimated ejection fraction was in the range of 55%   to 60%. Wall motion was normal; there were no regional wall   motion abnormalities. Left ventricular diastolic function   parameters were normal. - Aortic valve: There was trivial regurgitation. - Mitral valve: Mild focal calcification of the anterior leaflet. - Atrial septum: No defect or patent foramen ovale was identified.   ASSESSMENT AND PLAN:  1.  Paroxysmal atrial flutter: Currently on Eliquis. Plan for ablation on 02/10/17. Risks and benefits discussed. Risks include bleeding, tamponade, heart block, stroke. He understands these risks and has agreed to the procedure.  This patients CHA2DS2-VASc Score and unadjusted Ischemic Stroke Rate (% per year) is equal to 2.2 % stroke rate/year from a score of 2  Above score calculated as 1 point each if present [CHF, HTN, DM, Vascular=MI/PAD/Aortic Plaque, Age if 65-74, or Male] Above score calculated as 2 points each if present [Age > 75, or Stroke/TIA/TE]  2. Hypertension: Mildly elevated today but has been normal in the past. No changes at this time.    Current medicines are reviewed at length with the patient today.   The patient does not have concerns regarding his medicines.  The following changes were made today:  nne  Labs/ tests ordered today include:  Orders Placed This Encounter  Procedures  . Basic Metabolic Panel (BMET)  . CBC w/Diff      Disposition:   FU with Jina Olenick 1 months  Signed, Penney Domanski Jorja Loa, MD  02/08/2017 4:05 PM     [facility] [address] [place] [address] [phone] (office) [phone] (fax)

## 2017-02-08 NOTE — Patient Instructions (Addendum)
Medication Instructions:  Your physician recommends that you continue on your current medications as directed. Please refer to the Current Medication list given to you today.  Labwork: None ordered  Testing/Procedures: Your physician has recommended that you have an ablation. Catheter ablation is a medical procedure used to treat some cardiac arrhythmias (irregular heartbeats). During catheter ablation, a long, thin, flexible tube is put into a blood vessel in your groin (upper thigh), or neck. This tube is called an ablation catheter. It is then guided to your heart through the blood vessel. Radio frequency waves destroy small areas of heart tissue where abnormal heartbeats may cause an arrhythmia to start. Please see the instruction sheet given to you today.  Follow-Up: Keep your scheduled follow up with Dr. Estill Dooms on 03/22/2017 @ 3:45 pm in [facility]  -- If you need a refill on your cardiac medications before your next appointment, please call your pharmacy. --  Thank you for choosing [facility]!!   Dory Horn, RN [phone]  Any Other Special Instructions Will Be Listed Below (If Applicable).  Cardiac Ablation Cardiac ablation is a procedure to disable (ablate) a small amount of heart tissue in very specific places. The heart has many electrical connections. Sometimes these connections are abnormal and can cause the heart to beat very fast or irregularly. Ablating some of the problem areas can improve the heart rhythm or return it to normal. Ablation may be done for people who:  Have Wolff-Parkinson-White syndrome.  Have fast heart rhythms (tachycardia).  Have taken medicines for an abnormal heart rhythm (arrhythmia) that were not effective or caused side effects.  Have a high-risk heartbeat that may be life-threatening.  During the procedure, a small incision is made in the neck or the groin, and a long, thin, flexible tube (catheter) is inserted into the incision  and moved to the heart. Small devices (electrodes) on the tip of the catheter will send out electrical currents. A type of X-ray (fluoroscopy) will be used to help guide the catheter and to provide images of the heart. Tell a health care provider about:  Any allergies you have.  All medicines you are taking, including vitamins, herbs, eye drops, creams, and over-the-counter medicines.  Any problems you or family members have had with anesthetic medicines.  Any blood disorders you have.  Any surgeries you have had.  Any medical conditions you have, such as kidney failure.  Whether you are pregnant or may be pregnant. What are the risks? Generally, this is a safe procedure. However, problems may occur, including:  Infection.  Bruising and bleeding at the catheter insertion site.  Bleeding into the chest, especially into the sac that surrounds the heart. This is a serious complication.  Stroke or blood clots.  Damage to other structures or organs.  Allergic reaction to medicines or dyes.  Need for a permanent pacemaker if the normal electrical system is damaged. A pacemaker is a small computer that sends electrical signals to the heart and helps your heart beat normally.  The procedure not being fully effective. This may not be recognized until months later. Repeat ablation procedures are sometimes required.  What happens before the procedure?  Follow instructions from your health care provider about eating or drinking restrictions.  Ask your health care provider about: ? Changing or stopping your regular medicines. This is especially important if you are taking diabetes medicines or blood thinners. ? Taking medicines such as aspirin and ibuprofen. These medicines can thin your blood. Do  not take these medicines before your procedure if your health care provider instructs you not to.  Plan to have someone take you home from the hospital or clinic.  If you will be going home  right after the procedure, plan to have someone with you for 24 hours. What happens during the procedure?  To lower your risk of infection: ? Your health care team will wash or sanitize their hands. ? Your skin will be washed with soap. ? Hair may be removed from the incision area.  An IV tube will be inserted into one of your veins.  You will be given a medicine to help you relax (sedative).  The skin on your neck or groin will be numbed.  An incision will be made in your neck or your groin.  A needle will be inserted through the incision and into a large vein in your neck or groin.  A catheter will be inserted into the needle and moved to your heart.  Dye may be injected through the catheter to help your surgeon see the area of the heart that needs treatment.  Electrical currents will be sent from the catheter to ablate heart tissue in desired areas. There are three types of energy that may be used to ablate heart tissue: ? Heat (radiofrequency energy). ? Laser energy. ? Extreme cold (cryoablation).  When the necessary tissue has been ablated, the catheter will be removed.  Pressure will be held on the catheter insertion area to prevent excessive bleeding.  A bandage (dressing) will be placed over the catheter insertion area. The procedure may vary among health care providers and hospitals. What happens after the procedure?  Your blood pressure, heart rate, breathing rate, and blood oxygen level will be monitored until the medicines you were given have worn off.  Your catheter insertion area will be monitored for bleeding. You will need to lie still for a few hours to ensure that you do not bleed from the catheter insertion area.  Do not drive for 24 hours or as long as directed by your health care provider. Summary  Cardiac ablation is a procedure to disable (ablate) a small amount of heart tissue in very specific places. Ablating some of the problem areas can improve the  heart rhythm or return it to normal.  During the procedure, electrical currents will be sent from the catheter to ablate heart tissue in desired areas. This information is not intended to replace advice given to you by your health care provider. Make sure you discuss any questions you have with your health care provider. Document Released: 08/21/2008 Document Revised: 02/22/2016 Document Reviewed: 02/22/2016 Elsevier Interactive Patient Education  [employer]2018 [employer]

## 2017-02-09 LAB — CBC WITH DIFFERENTIAL/PLATELET
BASOS: 1 %
Basophils Absolute: 0 10*3/uL (ref 0.0–0.2)
EOS (ABSOLUTE): 0 10*3/uL (ref 0.0–0.4)
EOS: 0 %
HEMATOCRIT: 45.5 % (ref 37.5–51.0)
HEMOGLOBIN: 16 g/dL (ref 13.0–17.7)
Immature Grans (Abs): 0 10*3/uL (ref 0.0–0.1)
Immature Granulocytes: 0 %
LYMPHS ABS: 2.8 10*3/uL (ref 0.7–3.1)
Lymphs: 44 %
MCH: 32.2 pg (ref 26.6–33.0)
MCHC: 35.2 g/dL (ref 31.5–35.7)
MCV: 92 fL (ref 79–97)
MONOCYTES: 9 %
Monocytes Absolute: 0.5 10*3/uL (ref 0.1–0.9)
NEUTROS ABS: 3 10*3/uL (ref 1.4–7.0)
Neutrophils: 46 %
Platelets: 296 10*3/uL (ref 150–379)
RBC: 4.97 x10E6/uL (ref 4.14–5.80)
RDW: 14 % (ref 12.3–15.4)
WBC: 6.3 10*3/uL (ref 3.4–10.8)

## 2017-02-09 LAB — BASIC METABOLIC PANEL
BUN/Creatinine Ratio: 17 (ref 9–20)
BUN: 17 mg/dL (ref 6–24)
CALCIUM: 10 mg/dL (ref 8.7–10.2)
CO2: 23 mmol/L (ref 20–29)
Chloride: 101 mmol/L (ref 96–106)
Creatinine, Ser: 1.01 mg/dL (ref 0.76–1.27)
GFR calc Af Amer: 96 mL/min/{1.73_m2} (ref >59)
GFR, EST NON AFRICAN AMERICAN: 83 mL/min/{1.73_m2} (ref >59)
Glucose: 193 mg/dL — ABNORMAL HIGH (ref 65–99)
Potassium: 4.5 mmol/L (ref 3.5–5.2)
Sodium: 140 mmol/L (ref 134–144)

## 2017-02-09 NOTE — Anesthesia Preprocedure Evaluation (Addendum)
Anesthesia Evaluation  Patient identified by MRN, date of birth, ID band Patient awake    Reviewed: Allergy & Precautions, NPO status , Patient's Chart, lab work & pertinent test results, reviewed documented beta blocker date and time   History of Anesthesia Complications Negative for: history of anesthetic complications  Airway Mallampati: II  TM Distance: >3 FB Neck ROM: Full    Dental no notable dental hx. (+) Dental Advisory Given   Pulmonary neg pulmonary ROS,    Pulmonary exam normal        Cardiovascular hypertension, Pt. on home beta blockers Normal cardiovascular exam+ dysrhythmias Atrial Fibrillation      Neuro/Psych negative neurological ROS  negative psych ROS   GI/Hepatic negative GI ROS, Neg liver ROS,   Endo/Other  diabetes  Renal/GU negative Renal ROS     Musculoskeletal negative musculoskeletal ROS (+)   Abdominal   Peds  Hematology negative hematology ROS (+)   Anesthesia Other Findings Day of surgery medications reviewed with the patient.  Reproductive/Obstetrics                            Anesthesia Physical Anesthesia Plan  ASA: III  Anesthesia Plan: MAC   Post-op Pain Management:    Induction:   PONV Risk Score and Plan: 1 and Ondansetron and Dexamethasone  Airway Management Planned: Natural Airway and Simple Face Mask  Additional Equipment:   Intra-op Plan:   Post-operative Plan:   Informed Consent: I have reviewed the patients History and Physical, chart, labs and discussed the procedure including the risks, benefits and alternatives for the proposed anesthesia with the patient or authorized representative who has indicated his/her understanding and acceptance.   Dental advisory given  Plan Discussed with: CRNA and Anesthesiologist  Anesthesia Plan Comments:        Anesthesia Quick Evaluation

## 2017-02-10 ENCOUNTER — Encounter (HOSPITAL_COMMUNITY)
Admission: RE | Disposition: A | Discharge status: Home / Self Care | Payer: Self-pay | Source: Ambulatory Visit | Attending: Cardiology

## 2017-02-10 ENCOUNTER — Ambulatory Visit (HOSPITAL_COMMUNITY): Payer: 59 | Admitting: Certified Registered Nurse Anesthetist

## 2017-02-10 ENCOUNTER — Ambulatory Visit (HOSPITAL_COMMUNITY)
Admission: RE | Admit: 2017-02-10 | Discharge: 2017-02-10 | Disposition: A | Discharge status: Home / Self Care | Payer: 59 | Source: Ambulatory Visit | Attending: Cardiology | Admitting: Cardiology

## 2017-02-10 ENCOUNTER — Encounter (HOSPITAL_COMMUNITY): Payer: Self-pay | Admitting: Certified Registered Nurse Anesthetist

## 2017-02-10 DIAGNOSIS — I483 Typical atrial flutter: Secondary | ICD-10-CM | POA: Diagnosis not present

## 2017-02-10 DIAGNOSIS — I471 Supraventricular tachycardia: Secondary | ICD-10-CM | POA: Diagnosis not present

## 2017-02-10 DIAGNOSIS — E119 Type 2 diabetes mellitus without complications: Secondary | ICD-10-CM | POA: Insufficient documentation

## 2017-02-10 DIAGNOSIS — I10 Essential (primary) hypertension: Secondary | ICD-10-CM | POA: Insufficient documentation

## 2017-02-10 DIAGNOSIS — I4892 Unspecified atrial flutter: Secondary | ICD-10-CM | POA: Insufficient documentation

## 2017-02-10 HISTORY — PX: A-FLUTTER ABLATION: EP1230

## 2017-02-10 LAB — GLUCOSE, CAPILLARY
Glucose-Capillary: 128 mg/dL — ABNORMAL HIGH (ref 65–99)
Glucose-Capillary: 166 mg/dL — ABNORMAL HIGH (ref 65–99)

## 2017-02-10 SURGERY — A-FLUTTER ABLATION
Anesthesia: Monitor Anesthesia Care

## 2017-02-10 MED ORDER — SODIUM CHLORIDE 0.9 % IV SOLN
INTRAVENOUS | Status: DC | PRN
  Administered 2017-02-10: 07:00:00 via INTRAVENOUS

## 2017-02-10 MED ORDER — HEPARIN (PORCINE) IN NACL 2-0.9 UNIT/ML-% IJ SOLN
INTRAMUSCULAR | Status: AC | PRN
  Administered 2017-02-10: 1000 mL

## 2017-02-10 MED ORDER — MIDAZOLAM HCL 5 MG/5ML IJ SOLN
INTRAMUSCULAR | Status: DC | PRN
  Administered 2017-02-10: 2 mg via INTRAVENOUS

## 2017-02-10 MED ORDER — ONDANSETRON HCL 4 MG/2ML IJ SOLN
INTRAMUSCULAR | Status: DC | PRN
  Administered 2017-02-10: 4 mg via INTRAVENOUS

## 2017-02-10 MED ORDER — GLIMEPIRIDE 4 MG PO TABS
4.0000 mg | ORAL_TABLET | Freq: Every day | ORAL | Status: DC
  Filled 2017-02-10 (×2): qty 1

## 2017-02-10 MED ORDER — PROPOFOL 500 MG/50ML IV EMUL
INTRAVENOUS | Status: DC | PRN
  Administered 2017-02-10: 100 ug/kg/min via INTRAVENOUS

## 2017-02-10 MED ORDER — SODIUM CHLORIDE 0.9 % IV SOLN: 250.0000 mL | INTRAVENOUS | Status: DC | PRN

## 2017-02-10 MED ORDER — ONDANSETRON HCL 4 MG/2ML IJ SOLN: 4.0000 mg | Freq: Four times a day (QID) | INTRAMUSCULAR | Status: DC | PRN

## 2017-02-10 MED ORDER — BUPIVACAINE HCL (PF) 0.25 % IJ SOLN
INTRAMUSCULAR | Status: DC | PRN
  Administered 2017-02-10: 30 mL

## 2017-02-10 MED ORDER — FENTANYL CITRATE (PF) 100 MCG/2ML IJ SOLN
INTRAMUSCULAR | Status: DC | PRN
  Administered 2017-02-10 (×2): 25 ug via INTRAVENOUS

## 2017-02-10 MED ORDER — DEXAMETHASONE SODIUM PHOSPHATE 10 MG/ML IJ SOLN
INTRAMUSCULAR | Status: DC | PRN
  Administered 2017-02-10: 8 mg via INTRAVENOUS

## 2017-02-10 MED ORDER — LIDOCAINE 2% (20 MG/ML) 5 ML SYRINGE
INTRAMUSCULAR | Status: DC | PRN
  Administered 2017-02-10: 40 mg via INTRAVENOUS

## 2017-02-10 MED ORDER — ACETAMINOPHEN 325 MG PO TABS
650.0000 mg | ORAL_TABLET | Freq: Four times a day (QID) | ORAL | Status: DC | PRN
  Administered 2017-02-10: 650 mg via ORAL
  Filled 2017-02-10: qty 2

## 2017-02-10 MED ORDER — ACETAMINOPHEN 325 MG PO TABS
ORAL_TABLET | ORAL | Status: AC
  Administered 2017-02-10: 650 mg via ORAL
  Filled 2017-02-10: qty 2

## 2017-02-10 MED ORDER — SODIUM CHLORIDE 0.9% FLUSH: 3.0000 mL | Freq: Two times a day (BID) | INTRAVENOUS | Status: DC

## 2017-02-10 MED ORDER — SODIUM CHLORIDE 0.9% FLUSH: 3.0000 mL | INTRAVENOUS | Status: DC | PRN

## 2017-02-10 SURGICAL SUPPLY — 12 items
BAG SNAP BAND KOVER 36X36 (MISCELLANEOUS) ×3 IMPLANT
CATH EZ STEER NAV 8MM D-F CUR (ABLATOR) ×3 IMPLANT
CATH JOSEPHSON QUAD-ALLRED 6FR (CATHETERS) ×3 IMPLANT
CATH WEBSTER BI DIR CS D-F CRV (CATHETERS) ×3 IMPLANT
PACK EP LATEX FREE (CUSTOM PROCEDURE TRAY) ×2
PACK EP LF (CUSTOM PROCEDURE TRAY) ×1 IMPLANT
PAD DEFIB LIFELINK (PAD) ×3 IMPLANT
PATCH CARTO3 (PAD) ×3 IMPLANT
SHEATH PINNACLE 6F 10CM (SHEATH) ×3 IMPLANT
SHEATH PINNACLE 7F 10CM (SHEATH) ×3 IMPLANT
SHEATH PINNACLE 8F 10CM (SHEATH) ×3 IMPLANT
SHIELD RADPAD SCOOP 12X17 (MISCELLANEOUS) ×3 IMPLANT

## 2017-02-10 NOTE — Discharge Instructions (Signed)
No driving for 4 days. No lifting over 5 lbs for 1 week. No vigorous or sexual activity for 1 week. You may return to work on 02/17/17. Keep procedure site clean & dry. If you notice increased pain, swelling, bleeding or pus, call/return!  You may shower, but no soaking baths/hot tubs/pools for 1 week.        Cardiac Ablation Cardiac ablation is a procedure to disable (ablate) a small amount of heart tissue in very specific places. The heart has many electrical connections. Sometimes these connections are abnormal and can cause the heart to beat very fast or irregularly. Ablating some of the problem areas can improve the heart rhythm or return it to normal. Ablation may be done for people who:  Have Wolff-Parkinson-White syndrome.  Have fast heart rhythms (tachycardia).  Have taken medicines for an abnormal heart rhythm (arrhythmia) that were not effective or caused side effects.  Have a high-risk heartbeat that may be life-threatening.  During the procedure, a small incision is made in the neck or the groin, and a long, thin, flexible tube (catheter) is inserted into the incision and moved to the heart. Small devices (electrodes) on the tip of the catheter will send out electrical currents. A type of X-ray (fluoroscopy) will be used to help guide the catheter and to provide images of the heart. Tell a health care provider about:  Any allergies you have.  All medicines you are taking, including vitamins, herbs, eye drops, creams, and over-the-counter medicines.  Any problems you or family members have had with anesthetic medicines.  Any blood disorders you have.  Any surgeries you have had.  Any medical conditions you have, such as kidney failure.  Whether you are pregnant or may be pregnant. What are the risks? Generally, this is a safe procedure. However, problems may occur, including:  Infection.  Bruising and bleeding at the catheter insertion site.  Bleeding into the  chest, especially into the sac that surrounds the heart. This is a serious complication.  Stroke or blood clots.  Damage to other structures or organs.  Allergic reaction to medicines or dyes.  Need for a permanent pacemaker if the normal electrical system is damaged. A pacemaker is a small computer that sends electrical signals to the heart and helps your heart beat normally.  The procedure not being fully effective. This may not be recognized until months later. Repeat ablation procedures are sometimes required.  What happens before the procedure?  Follow instructions from your health care provider about eating or drinking restrictions.  Ask your health care provider about: ? Changing or stopping your regular medicines. This is especially important if you are taking diabetes medicines or blood thinners. ? Taking medicines such as aspirin and ibuprofen. These medicines can thin your blood. Do not take these medicines before your procedure if your health care provider instructs you not to.  Plan to have someone take you home from the hospital or clinic.  If you will be going home right after the procedure, plan to have someone with you for 24 hours. What happens during the procedure?  To lower your risk of infection: ? Your health care team will wash or sanitize their hands. ? Your skin will be washed with soap. ? Hair may be removed from the incision area.  An IV tube will be inserted into one of your veins.  You will be given a medicine to help you relax (sedative).  The skin on your neck or groin will  be numbed.  An incision will be made in your neck or your groin.  A needle will be inserted through the incision and into a large vein in your neck or groin.  A catheter will be inserted into the needle and moved to your heart.  Dye may be injected through the catheter to help your surgeon see the area of the heart that needs treatment.  Electrical currents will be sent  from the catheter to ablate heart tissue in desired areas. There are three types of energy that may be used to ablate heart tissue: ? Heat (radiofrequency energy). ? Laser energy. ? Extreme cold (cryoablation).  When the necessary tissue has been ablated, the catheter will be removed.  Pressure will be held on the catheter insertion area to prevent excessive bleeding.  A bandage (dressing) will be placed over the catheter insertion area. The procedure may vary among health care providers and hospitals. What happens after the procedure?  Your blood pressure, heart rate, breathing rate, and blood oxygen level will be monitored until the medicines you were given have worn off.  Your catheter insertion area will be monitored for bleeding. You will need to lie still for a few hours to ensure that you do not bleed from the catheter insertion area.  Do not drive for 24 hours or as long as directed by your health care provider. Summary  Cardiac ablation is a procedure to disable (ablate) a small amount of heart tissue in very specific places. Ablating some of the problem areas can improve the heart rhythm or return it to normal.  During the procedure, electrical currents will be sent from the catheter to ablate heart tissue in desired areas. This information is not intended to replace advice given to you by your health care provider. Make sure you discuss any questions you have with your health care provider. Document Released: 08/21/2008 Document Revised: 02/22/2016 Document Reviewed: 02/22/2016 Elsevier Interactive Patient Education  [employer].

## 2017-02-10 NOTE — H&P (Signed)
Thomas Juarez is a 56 y.o. male with a history of atrial flutter. Flutter appears typical on surface ECG. He presents for ablation. On exam, RRR, no murmurs, lungs clear. He is clear that he has been compliant with his anticoagulation. Risks and benefits discussed. Risks include but not limited to bleeding, tamponade, heart block, stroke. The patient understands the risks and has agreed to the procedure.  Alyia Lacerte Elberta Fortisamnitz, MD 02/10/2017 7:08 AM

## 2017-02-10 NOTE — Transfer of Care (Signed)
Immediate Anesthesia Transfer of Care Note  Patient: Thomas Juarez  Procedure(s) Performed: A-Flutter Ablation (N/A )  Patient Location: Cath Lab  Anesthesia Type:MAC  Level of Consciousness: awake, alert  and oriented  Airway & Oxygen Therapy: Patient Spontanous Breathing and Patient connected to nasal cannula oxygen  Post-op Assessment: Report given to RN and Post -op Vital signs reviewed and stable  Post vital signs: Reviewed and stable  Last Vitals:  Vitals:   02/10/17 0545 02/10/17 0917  BP: (!) 144/86   Pulse: 79   Resp: 18   Temp: 36.8 C (!) 36.4 C  SpO2: 98%     Last Pain:  Vitals:   02/10/17 0917  TempSrc: Temporal         Complications: No apparent anesthesia complications

## 2017-02-10 NOTE — Anesthesia Postprocedure Evaluation (Signed)
Anesthesia Post Note  Patient: Thomas Juarez  Procedure(s) Performed: A-Flutter Ablation (N/A )     Patient location during evaluation: PACU Anesthesia Type: MAC Level of consciousness: awake and alert Pain management: pain level controlled Vital Signs Assessment: post-procedure vital signs reviewed and stable Respiratory status: spontaneous breathing and respiratory function stable Cardiovascular status: stable Postop Assessment: no apparent nausea or vomiting Anesthetic complications: no    Last Vitals:  Vitals:   02/10/17 0945 02/10/17 0950  BP: 117/68 121/66  Pulse: 65 63  Resp: 11 (!) 9  Temp:    SpO2: 97% 98%    Last Pain:  Vitals:   02/10/17 0917  TempSrc: Temporal                 Thomas Juarez

## 2017-02-10 NOTE — Progress Notes (Signed)
Site area: rt groin fv sheaths x3 Site Prior to Removal:  Level 0 Pressure Applied For:  20 minutes Manual:   yes Patient Status During Pull:  stable Post Pull Site:  Level  0 Post Pull Instructions Given:  yes Post Pull Pulses Present: palpable Dressing Applied:   Gauze and tegaderm Bedrest begins @ 0945 Comments:  IV  Saline locked

## 2017-02-10 NOTE — Progress Notes (Signed)
Patient was seen post procedure by Dr. Elberta Fortisamnitz. The patient is feeling well without CP, palpitations or SOB, no procedure site pain R groin is soft, non-tender, no hematoma, no bleeding Activity and wound instructions were discussed with the patient SR on telemetry, VSS  Discharge when bed rest is completed Francis Dowseenee Ursuy, PA-C  Loman BrooklynWill Chekesha Behlke, MD

## 2017-02-13 ENCOUNTER — Telehealth: Payer: Self-pay | Admitting: Cardiology

## 2017-02-13 ENCOUNTER — Encounter (HOSPITAL_COMMUNITY): Payer: Self-pay | Admitting: Cardiology

## 2017-02-13 NOTE — Telephone Encounter (Signed)
Called pt to inform him of his lab results and pt stated that he is experiencing bruising from the procedure that he had done by Dr. Elberta Fortisamnitz. Please advise

## 2017-02-13 NOTE — Telephone Encounter (Signed)
Spoke with patient who is reporting soreness and bruising a site of ablation procedure in his groin.  He reports the site is soft and without redness, warmth or drainage.  He had no further concerns and will call back if changes.

## 2017-03-07 ENCOUNTER — Telehealth: Payer: Self-pay | Admitting: Cardiology

## 2017-03-07 NOTE — Telephone Encounter (Signed)
New message    Patient calling with questions about what medication to take for "head cold" ,cough and congestion that will not interfere with heart medication and blood thinners. Please call

## 2017-03-07 NOTE — Telephone Encounter (Signed)
Advised to avoid medications w/ decongestants/sudafed.  Suggested examples: coricidin cold w/ the heart on the box or plain robitussin. However, explained to pt that if he did take medication w/ decongestant/sudafed (if taken very rarely) that he may experience increased HR/AFlutter and ok if he takes it only occasionally PRN.  (Dr. Elberta Fortisamnitz aware of these instructions) Patient verbalized understanding and agreeable to plan.

## 2017-03-13 ENCOUNTER — Ambulatory Visit: Payer: 59 | Admitting: Cardiology

## 2017-03-21 NOTE — Progress Notes (Signed)
Electrophysiology Office Note   Date:  03/22/2017   ID:  Thomas Kendallommy Darren Ringenberg, DOB 06/27/1960, MRN [id number]  PCP:  Iva BoopVia, Kevin, MD  Cardiologist:  [facility] Primary Electrophysiologist:  Will Jorja LoaMartin Camnitz, MD    Chief Complaint  Patient presents with  . Follow-up    Typical AFlutter     History of Present Illness: Thomas Juarez is a 56 y.o. male who is being seen today for the evaluation of atrial flutter at the request of Via, Caryn BeeKevin, MD. Presenting today for electrophysiology evaluation. He has a history of diabetes and hypertension. He presented to the emergency room on 12/29/16 with atrial flutter. The day of admission, his daughter noted his heart rate was high. He went to his primary physician's and was noted to be in atrial flutter with rapid rates. His heart rate in the emergency room with 154 bpm. He converted to sinus rhythm while in the hospital and was thus discharged.  Had ablation for atrial flutter on 02/10/17.  Today, denies symptoms of palpitations, chest pain, shortness of breath, orthopnea, PND, lower extremity edema, claudication, dizziness, presyncope, syncope, bleeding, or neurologic sequela. The patient is tolerating medications without difficulties.  He is currently feeling well.  He has much more energy since his ablation.  He has not had any further episodes of palpitations, weakness, or fatigue.   Past Medical History:  Diagnosis Date  . Diabetes mellitus without complication (HCC)   . Hypertension    Past Surgical History:  Procedure Laterality Date  . A-FLUTTER ABLATION N/A 02/10/2017   Procedure: A-Flutter Ablation;  Surgeon: Regan Lemmingamnitz, Will Martin, MD;  Location: [facility];  Service: Cardiovascular;  Laterality: N/A;  . HERNIA REPAIR  1988  . KNEE SURGERY Bilateral 2005, 2007  . TONSILLECTOMY       Current Outpatient Medications  Medication Sig Dispense Refill  . acetaminophen (TYLENOL) 325 MG tablet Take 2 tablets (650 mg total) by  mouth every 6 (six) hours as needed for mild pain or moderate pain. 60 tablet 0  . Albiglutide (TANZEUM) 50 MG PEN Inject 50 mg into the skin once a week.    Marland Kitchen. apixaban (ELIQUIS) 5 MG TABS tablet Take 1 tablet (5 mg total) by mouth 2 (two) times daily. 60 tablet 11  . atorvastatin (LIPITOR) 40 MG tablet Take 40 mg by mouth daily.     . cholecalciferol (VITAMIN D) 1000 units tablet Take 1,000 Units by mouth daily.    Marland Kitchen. glimepiride (AMARYL) 4 MG tablet Take 4 mg by mouth daily.    . INVOKANA 300 MG TABS Take 300 mg by mouth daily before breakfast.     . metoprolol tartrate (LOPRESSOR) 25 MG tablet Take 1 tablet (25 mg total) by mouth 2 (two) times daily. May take extra half tablet as needed for high heart rate 90 tablet 2  . pioglitazone (ACTOS) 45 MG tablet Take 45 mg by mouth daily.      No current facility-administered medications for this visit.     Allergies:   Metformin   Social History:  The patient  reports that  has never smoked. he has never used smokeless tobacco. He reports that he drinks alcohol. He reports that he does not use drugs.   Family History:  The patient's family history includes Diabetes in his brother and mother; Hypertension in his father.    ROS:  Please see the history of present illness.   Otherwise, review of systems is positive for none.  All other systems are reviewed and negative.   PHYSICAL EXAM: VS:  BP (!) 151/84   Pulse 71   Ht 5\' 11"  (1.803 m)   Wt 216 lb 3.2 oz (98.1 kg)   SpO2 100%   BMI 30.15 kg/m  , BMI Body mass index is 30.15 kg/m. GEN: Well nourished, well developed, in no acute distress  HEENT: normal  Neck: no JVD, carotid bruits, or masses Cardiac: RRR; no murmurs, rubs, or gallops,no edema  Respiratory:  clear to auscultation bilaterally, normal work of breathing GI: soft, nontender, nondistended, + BS MS: no deformity or atrophy  Skin: warm and dry Neuro:  Strength and sensation are intact Psych: euthymic mood, full  affect  EKG:  EKG is ordered today. Personal review of the ekg ordered shows sinus rhythm, rate 71  Recent Labs: 12/29/2016: Magnesium 1.9; TSH 1.909 02/08/2017: BUN 17; Creatinine, Ser 1.01; Hemoglobin 16.0; Platelets 296; Potassium 4.5; Sodium 140    Lipid Panel  No results found for: CHOL, TRIG, HDL, CHOLHDL, VLDL, LDLCALC, LDLDIRECT   Wt Readings from Last 3 Encounters:  03/22/17 216 lb 3.2 oz (98.1 kg)  02/10/17 215 lb (97.5 kg)  02/08/17 216 lb 12.8 oz (98.3 kg)      Other studies Reviewed: Additional studies/ records that were reviewed today include: TTE 12/30/16  Review of the above records today demonstrates:  - Left ventricle: The cavity size was normal. Systolic function was   normal. The estimated ejection fraction was in the range of 55%   to 60%. Wall motion was normal; there were no regional wall   motion abnormalities. Left ventricular diastolic function   parameters were normal. - Aortic valve: There was trivial regurgitation. - Mitral valve: Mild focal calcification of the anterior leaflet. - Atrial septum: No defect or patent foramen ovale was identified.   ASSESSMENT AND PLAN:  1.  Paroxysmal atrial flutter: Had ablation 02/10/17.  In sinus rhythm today.  Has had no further episodes of palpitations weakness or fatigue.  Due to that, we will take him off of his Eliquis and stop his metoprolol.  He is feeling well without complaint.  I will see him back on an as-needed basis.  He will follow-up with his primary cardiologist.  This patients CHA2DS2-VASc Score and unadjusted Ischemic Stroke Rate (% per year) is equal to 2.2 % stroke rate/year from a score of 2  Above score calculated as 1 point each if present [CHF, HTN, DM, Vascular=MI/PAD/Aortic Plaque, Age if 65-74, or Male] Above score calculated as 2 points each if present [Age > 75, or Stroke/TIA/TE]  2. Hypertension: Blood pressure is mildly elevated today but has been normal at home.  We are  stopping his metoprolol and he will restart his ramipril which may help to control his blood pressure in the future.    Current medicines are reviewed at length with the patient today.   The patient does not have concerns regarding his medicines.  The following changes were made today:  nne  Labs/ tests ordered today include:  Orders Placed This Encounter  Procedures  . EKG 12-Lead     Disposition:   FU with Will Camnitz 1 months  Signed, Will Jorja LoaMartin Camnitz, MD  03/22/2017 3:58 PM     [facility] [address] [place] [address] [phone] (office) [phone] (fax)

## 2017-03-22 ENCOUNTER — Ambulatory Visit (INDEPENDENT_AMBULATORY_CARE_PROVIDER_SITE_OTHER): Payer: 59 | Admitting: Cardiology

## 2017-03-22 ENCOUNTER — Encounter: Payer: Self-pay | Admitting: Cardiology

## 2017-03-22 VITALS — BP 151/84 | HR 71 | Ht 71.0 in | Wt 216.2 lb

## 2017-03-22 DIAGNOSIS — I10 Essential (primary) hypertension: Secondary | ICD-10-CM

## 2017-03-22 DIAGNOSIS — I483 Typical atrial flutter: Secondary | ICD-10-CM | POA: Diagnosis not present

## 2017-03-22 MED ORDER — RAMIPRIL 5 MG PO CAPS
5.0000 mg | ORAL_CAPSULE | Freq: Every day | ORAL | 3 refills | Status: AC
Start: 2017-03-22 — End: 2017-06-20

## 2017-03-22 NOTE — Patient Instructions (Signed)
Medication Instructions:  Your physician has recommended you make the following change in your medication:  1. STOP Eliquis 2. STOP Metoprolol 3. RESTART Ramipril 5 mg daily  * If you need a refill on your cardiac medications before your next appointment, please call your pharmacy. *  Labwork: None ordered  Testing/Procedures: None ordered  Follow-Up: No follow up is needed at this time with Dr. Elberta Fortisamnitz.  He will see you on an as needed basis.  Thank you for choosing [facility]!!   Dory HornSherri Cedarius Kersh, RN [phone]

## 2019-01-02 ENCOUNTER — Other Ambulatory Visit: Payer: Self-pay

## 2019-01-02 DIAGNOSIS — Z20822 Contact with and (suspected) exposure to covid-19: Secondary | ICD-10-CM

## 2019-01-03 LAB — NOVEL CORONAVIRUS, NAA: SARS-CoV-2, NAA: NOT DETECTED

## 2019-01-09 ENCOUNTER — Other Ambulatory Visit: Payer: Self-pay

## 2019-01-09 DIAGNOSIS — Z20822 Contact with and (suspected) exposure to covid-19: Secondary | ICD-10-CM

## 2019-01-11 LAB — NOVEL CORONAVIRUS, NAA: SARS-CoV-2, NAA: NOT DETECTED
